# Patient Record
Sex: Male | Born: 1990 | Race: White | Hispanic: No | Marital: Single | Smoking: Current every day smoker
Health system: Southern US, Community
[De-identification: ages and names within clinical notes are randomized; demographics above are authoritative.]

## PROBLEM LIST (undated history)

## (undated) DIAGNOSIS — I2699 Other pulmonary embolism without acute cor pulmonale: Secondary | ICD-10-CM

## (undated) DIAGNOSIS — F329 Major depressive disorder, single episode, unspecified: Secondary | ICD-10-CM

## (undated) DIAGNOSIS — F988 Other specified behavioral and emotional disorders with onset usually occurring in childhood and adolescence: Secondary | ICD-10-CM

## (undated) DIAGNOSIS — F319 Bipolar disorder, unspecified: Secondary | ICD-10-CM

## (undated) DIAGNOSIS — F32A Depression, unspecified: Secondary | ICD-10-CM

## (undated) HISTORY — PX: OTHER SURGICAL HISTORY: SHX169

---
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
## 2012-02-27 ENCOUNTER — Encounter (HOSPITAL_COMMUNITY): Payer: Self-pay | Admitting: *Deleted

## 2012-02-27 ENCOUNTER — Inpatient Hospital Stay (HOSPITAL_COMMUNITY)
Admission: EM | Admit: 2012-02-27 | Discharge: 2012-03-03 | DRG: 964 | Disposition: A | Discharge status: Home / Self Care | Payer: Commercial Managed Care - PPO | Attending: General Surgery | Admitting: General Surgery

## 2012-02-27 ENCOUNTER — Emergency Department (HOSPITAL_COMMUNITY): Payer: Commercial Managed Care - PPO

## 2012-02-27 DIAGNOSIS — L299 Pruritus, unspecified: Secondary | ICD-10-CM | POA: Diagnosis not present

## 2012-02-27 DIAGNOSIS — S32599A Other specified fracture of unspecified pubis, initial encounter for closed fracture: Secondary | ICD-10-CM | POA: Diagnosis present

## 2012-02-27 DIAGNOSIS — S270XXA Traumatic pneumothorax, initial encounter: Secondary | ICD-10-CM

## 2012-02-27 DIAGNOSIS — S36039A Unspecified laceration of spleen, initial encounter: Secondary | ICD-10-CM

## 2012-02-27 DIAGNOSIS — S3609XA Other injury of spleen, initial encounter: Secondary | ICD-10-CM | POA: Diagnosis present

## 2012-02-27 DIAGNOSIS — Y9210 Unspecified residential institution as the place of occurrence of the external cause: Secondary | ICD-10-CM | POA: Diagnosis not present

## 2012-02-27 DIAGNOSIS — F988 Other specified behavioral and emotional disorders with onset usually occurring in childhood and adolescence: Secondary | ICD-10-CM | POA: Diagnosis present

## 2012-02-27 DIAGNOSIS — S2249XA Multiple fractures of ribs, unspecified side, initial encounter for closed fracture: Secondary | ICD-10-CM | POA: Diagnosis present

## 2012-02-27 DIAGNOSIS — S36899A Unspecified injury of other intra-abdominal organs, initial encounter: Secondary | ICD-10-CM | POA: Diagnosis present

## 2012-02-27 DIAGNOSIS — D62 Acute posthemorrhagic anemia: Secondary | ICD-10-CM | POA: Diagnosis present

## 2012-02-27 DIAGNOSIS — R339 Retention of urine, unspecified: Secondary | ICD-10-CM | POA: Diagnosis present

## 2012-02-27 DIAGNOSIS — S329XXA Fracture of unspecified parts of lumbosacral spine and pelvis, initial encounter for closed fracture: Secondary | ICD-10-CM

## 2012-02-27 DIAGNOSIS — S3210XA Unspecified fracture of sacrum, initial encounter for closed fracture: Secondary | ICD-10-CM | POA: Diagnosis present

## 2012-02-27 DIAGNOSIS — S3600XA Unspecified injury of spleen, initial encounter: Secondary | ICD-10-CM | POA: Diagnosis present

## 2012-02-27 DIAGNOSIS — S32509A Unspecified fracture of unspecified pubis, initial encounter for closed fracture: Principal | ICD-10-CM | POA: Diagnosis present

## 2012-02-27 DIAGNOSIS — S27329A Contusion of lung, unspecified, initial encounter: Secondary | ICD-10-CM | POA: Diagnosis present

## 2012-02-27 DIAGNOSIS — N390 Urinary tract infection, site not specified: Secondary | ICD-10-CM | POA: Diagnosis not present

## 2012-02-27 DIAGNOSIS — T40605A Adverse effect of unspecified narcotics, initial encounter: Secondary | ICD-10-CM | POA: Diagnosis not present

## 2012-02-27 HISTORY — DX: Other specified behavioral and emotional disorders with onset usually occurring in childhood and adolescence: F98.8

## 2012-02-27 LAB — CBC
HCT: 38.9 % — ABNORMAL LOW (ref 39.0–52.0)
HCT: 34.5 % — ABNORMAL LOW (ref 39.0–52.0)
HCT: 31.4 % — ABNORMAL LOW (ref 39.0–52.0)
HCT: 32 % — ABNORMAL LOW (ref 39.0–52.0)
Hemoglobin: 14.3 g/dL (ref 13.0–17.0)
Hemoglobin: 12.6 g/dL — ABNORMAL LOW (ref 13.0–17.0)
Hemoglobin: 11.4 g/dL — ABNORMAL LOW (ref 13.0–17.0)
MCH: 29.7 pg (ref 26.0–34.0)
MCH: 29.6 pg (ref 26.0–34.0)
MCH: 29.5 pg (ref 26.0–34.0)
MCHC: 36.8 g/dL — ABNORMAL HIGH (ref 30.0–36.0)
MCHC: 36.3 g/dL — ABNORMAL HIGH (ref 30.0–36.0)
MCV: 80.9 fL (ref 78.0–100.0)
MCV: 81.4 fL (ref 78.0–100.0)
MCV: 81.6 fL (ref 78.0–100.0)
MCV: 82.1 fL (ref 78.0–100.0)
Platelets: 286 10*3/uL (ref 150–400)
Platelets: 159 10*3/uL (ref 150–400)
RBC: 4.81 MIL/uL (ref 4.22–5.81)
RBC: 4.24 MIL/uL (ref 4.22–5.81)
RBC: 3.85 MIL/uL — ABNORMAL LOW (ref 4.22–5.81)
RDW: 12.5 % (ref 11.5–15.5)
RDW: 12.7 % (ref 11.5–15.5)
RDW: 13 % (ref 11.5–15.5)
WBC: 15.4 10*3/uL — ABNORMAL HIGH (ref 4.0–10.5)
WBC: 18.9 10*3/uL — ABNORMAL HIGH (ref 4.0–10.5)
WBC: 9.5 10*3/uL (ref 4.0–10.5)

## 2012-02-27 LAB — BASIC METABOLIC PANEL
BUN: 11 mg/dL (ref 6–23)
CO2: 26 mEq/L (ref 19–32)
Calcium: 9 mg/dL (ref 8.4–10.5)
Chloride: 103 mEq/L (ref 96–112)
Creatinine, Ser: 1.12 mg/dL (ref 0.50–1.35)
GFR calc Af Amer: >90 mL/min (ref >90)
GFR calc non Af Amer: >90 mL/min (ref >90)
Glucose, Bld: 183 mg/dL — ABNORMAL HIGH (ref 70–99)
Potassium: 3.5 mEq/L (ref 3.5–5.1)
Sodium: 140 mEq/L (ref 135–145)

## 2012-02-27 LAB — POCT I-STAT, CHEM 8
BUN: 10 mg/dL (ref 6–23)
Calcium, Ion: 1.18 mmol/L (ref 1.12–1.32)
Chloride: 103 mEq/L (ref 96–112)
Creatinine, Ser: 1.1 mg/dL (ref 0.50–1.35)
Glucose, Bld: 183 mg/dL — ABNORMAL HIGH (ref 70–99)
HCT: 41 % (ref 39.0–52.0)
Hemoglobin: 13.9 g/dL (ref 13.0–17.0)
Potassium: 3.4 mEq/L — ABNORMAL LOW (ref 3.5–5.1)
Sodium: 143 mEq/L (ref 135–145)
TCO2: 26 mmol/L (ref 0–100)

## 2012-02-27 LAB — DIFFERENTIAL
Basophils Absolute: 0 10*3/uL (ref 0.0–0.1)
Basophils Relative: 0 % (ref 0–1)
Eosinophils Absolute: 0.1 10*3/uL (ref 0.0–0.7)
Eosinophils Relative: 1 % (ref 0–5)
Lymphocytes Relative: 21 % (ref 12–46)
Lymphs Abs: 3.2 10*3/uL (ref 0.7–4.0)
Monocytes Absolute: 0.5 10*3/uL (ref 0.1–1.0)
Monocytes Relative: 3 % (ref 3–12)
Neutro Abs: 11.7 10*3/uL — ABNORMAL HIGH (ref 1.7–7.7)
Neutrophils Relative %: 76 % (ref 43–77)

## 2012-02-27 LAB — URINALYSIS, ROUTINE W REFLEX MICROSCOPIC
Bilirubin Urine: NEGATIVE
Glucose, UA: NEGATIVE mg/dL
Ketones, ur: NEGATIVE mg/dL
Leukocytes, UA: NEGATIVE
Nitrite: NEGATIVE
Protein, ur: NEGATIVE mg/dL
Specific Gravity, Urine: >1.046 — ABNORMAL HIGH (ref 1.005–1.030)
Urobilinogen, UA: 0.2 mg/dL (ref 0.0–1.0)
pH: 5 (ref 5.0–8.0)

## 2012-02-27 LAB — MRSA PCR SCREENING: MRSA by PCR: NEGATIVE

## 2012-02-27 MED ORDER — MORPHINE SULFATE 4 MG/ML IJ SOLN
4.0000 mg | Freq: Once | INTRAMUSCULAR | Status: AC
  Administered 2012-02-27: 4 mg via INTRAVENOUS
  Filled 2012-02-27: qty 1

## 2012-02-27 MED ORDER — MORPHINE SULFATE 4 MG/ML IJ SOLN
INTRAMUSCULAR | Status: AC
  Administered 2012-02-27: 4 mg
  Filled 2012-02-27: qty 1

## 2012-02-27 MED ORDER — DEXTROSE IN LACTATED RINGERS 5 % IV SOLN
INTRAVENOUS | Status: DC
  Administered 2012-02-27 – 2012-02-29 (×5): via INTRAVENOUS

## 2012-02-27 MED ORDER — ONDANSETRON HCL 4 MG PO TABS: 4.0000 mg | ORAL_TABLET | Freq: Four times a day (QID) | ORAL | Status: DC | PRN

## 2012-02-27 MED ORDER — MORPHINE SULFATE 2 MG/ML IJ SOLN
INTRAMUSCULAR | Status: AC
  Filled 2012-02-27: qty 1

## 2012-02-27 MED ORDER — ONDANSETRON HCL 4 MG/2ML IJ SOLN
INTRAMUSCULAR | Status: AC
  Filled 2012-02-27: qty 2

## 2012-02-27 MED ORDER — ONDANSETRON HCL 4 MG/2ML IJ SOLN
4.0000 mg | Freq: Four times a day (QID) | INTRAMUSCULAR | Status: DC | PRN
  Administered 2012-02-27: 4 mg via INTRAVENOUS

## 2012-02-27 MED ORDER — PANTOPRAZOLE SODIUM 40 MG PO TBEC: 40.0000 mg | DELAYED_RELEASE_TABLET | Freq: Every day | ORAL | Status: DC

## 2012-02-27 MED ORDER — IOHEXOL 300 MG/ML  SOLN
100.0000 mL | Freq: Once | INTRAMUSCULAR | Status: AC | PRN
  Administered 2012-02-27: 100 mL via INTRAVENOUS

## 2012-02-27 MED ORDER — CHLORHEXIDINE GLUCONATE 0.12 % MT SOLN
OROMUCOSAL | Status: AC
  Administered 2012-02-27: 20:00:00
  Filled 2012-02-27: qty 15

## 2012-02-27 MED ORDER — MORPHINE SULFATE 2 MG/ML IJ SOLN
1.0000 mg | INTRAMUSCULAR | Status: DC | PRN
  Administered 2012-02-27 – 2012-02-28 (×14): 2 mg via INTRAVENOUS
  Filled 2012-02-27 (×14): qty 1

## 2012-02-27 MED ORDER — ONDANSETRON HCL 4 MG/2ML IJ SOLN
4.0000 mg | Freq: Once | INTRAMUSCULAR | Status: AC
  Administered 2012-02-27: 4 mg via INTRAVENOUS
  Filled 2012-02-27: qty 2

## 2012-02-27 MED ORDER — PANTOPRAZOLE SODIUM 40 MG IV SOLR
40.0000 mg | Freq: Every day | INTRAVENOUS | Status: DC
Start: 2012-02-27 — End: 2012-02-29
  Administered 2012-02-27 – 2012-02-28 (×2): 40 mg via INTRAVENOUS
  Filled 2012-02-27 (×3): qty 40
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
## 2012-02-27 NOTE — ED Notes (Signed)
Attempted to call report to [phone], informed nurse would call back soon.  
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
## 2012-02-28 ENCOUNTER — Inpatient Hospital Stay (HOSPITAL_COMMUNITY): Payer: Commercial Managed Care - PPO

## 2012-02-28 DIAGNOSIS — S32599A Other specified fracture of unspecified pubis, initial encounter for closed fracture: Secondary | ICD-10-CM | POA: Diagnosis present

## 2012-02-28 DIAGNOSIS — S3600XA Unspecified injury of spleen, initial encounter: Secondary | ICD-10-CM | POA: Diagnosis present

## 2012-02-28 DIAGNOSIS — S3210XA Unspecified fracture of sacrum, initial encounter for closed fracture: Secondary | ICD-10-CM | POA: Diagnosis present

## 2012-02-28 DIAGNOSIS — D62 Acute posthemorrhagic anemia: Secondary | ICD-10-CM | POA: Diagnosis not present

## 2012-02-28 DIAGNOSIS — S270XXA Traumatic pneumothorax, initial encounter: Secondary | ICD-10-CM | POA: Diagnosis present

## 2012-02-28 LAB — CBC
HCT: 32.3 % — ABNORMAL LOW (ref 39.0–52.0)
Hemoglobin: 11.4 g/dL — ABNORMAL LOW (ref 13.0–17.0)
MCH: 29.8 pg (ref 26.0–34.0)
MCHC: 35.9 g/dL (ref 30.0–36.0)
MCHC: 36.2 g/dL — ABNORMAL HIGH (ref 30.0–36.0)
MCV: 82.6 fL (ref 78.0–100.0)
Platelets: 154 10*3/uL (ref 150–400)
RDW: 12.8 % (ref 11.5–15.5)
RDW: 12.9 % (ref 11.5–15.5)

## 2012-02-28 LAB — BASIC METABOLIC PANEL
BUN: 5 mg/dL — ABNORMAL LOW (ref 6–23)
CO2: 28 mEq/L (ref 19–32)
Calcium: 8.8 mg/dL (ref 8.4–10.5)
Chloride: 101 mEq/L (ref 96–112)
Creatinine, Ser: 0.85 mg/dL (ref 0.50–1.35)

## 2012-02-28 MED ORDER — AMPHETAMINE-DEXTROAMPHET ER 20 MG PO CP24
20.0000 mg | ORAL_CAPSULE | Freq: Two times a day (BID) | ORAL | Status: DC
  Filled 2012-02-28 (×3): qty 2

## 2012-02-28 MED ORDER — MORPHINE SULFATE 4 MG/ML IJ SOLN
INTRAMUSCULAR | Status: AC
  Administered 2012-02-28: 2 mg
  Filled 2012-02-28: qty 1

## 2012-02-28 MED ORDER — OXYCODONE-ACETAMINOPHEN 5-325 MG PO TABS
1.0000 | ORAL_TABLET | ORAL | Status: DC | PRN
  Administered 2012-02-28 (×2): 1 via ORAL
  Administered 2012-02-28 – 2012-02-29 (×3): 2 via ORAL
  Filled 2012-02-28: qty 1
  Filled 2012-02-28 (×2): qty 2
  Filled 2012-02-28: qty 1
  Filled 2012-02-28: qty 2

## 2012-02-28 NOTE — Progress Notes (Signed)
James Barajas  MRN: [id number] DOB/Age: 19-May-1991 20 y.o. Physician: Jacquelyne Balint Procedure:       Subjective: Awake and alert. Most complaints are of right hip pain.   Vital Signs Temp:  [98.2 F (36.8 C)-100.8 F (38.2 C)] 98.3 F (36.8 C) (05/20 1136) Pulse Rate:  [88-108] 95  (05/20 1000) Resp:  [0-26] 9  (05/20 1000) BP: (92-110)/(47-64) 106/56 mmHg (05/20 1000) SpO2:  [92 %-99 %] 97 % (05/20 1000)  Lab Results  Basename 02/28/12 0600 02/28/12 0033  WBC 10.3 9.6  HGB 11.6* 11.4*  HCT 32.3* 31.5*  PLT 154 148*   BMET  Basename 02/28/12 0600 02/27/12 0105 02/27/12 0055  NA 135 143 --  K 3.5 3.4* --  CL 101 103 --  CO2 28 -- 26  GLUCOSE 115* 183* --  BUN 5* 10 --  CREATININE 0.85 1.10 --  CALCIUM 8.8 -- 9.0   No results found for this basename: inr     Exam Moving feet and ankles well. NVI        Plan Mobilize with PT WBAT  Discussed with patient and mother.  Fu for xray in 1 month  James Barajas for Dr.Kevin Supple 02/28/2012, 12:42 PM
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
## 2012-02-29 LAB — CBC
HCT: 30.3 % — ABNORMAL LOW (ref 39.0–52.0)
Hemoglobin: 11 g/dL — ABNORMAL LOW (ref 13.0–17.0)
MCH: 29.7 pg (ref 26.0–34.0)
MCHC: 36.3 g/dL — ABNORMAL HIGH (ref 30.0–36.0)
RDW: 12.5 % (ref 11.5–15.5)

## 2012-02-29 LAB — URINALYSIS, ROUTINE W REFLEX MICROSCOPIC
Glucose, UA: NEGATIVE mg/dL
Hgb urine dipstick: NEGATIVE
Ketones, ur: 15 mg/dL — AB
Protein, ur: NEGATIVE mg/dL
Urobilinogen, UA: 1 mg/dL (ref 0.0–1.0)

## 2012-02-29 LAB — URINE MICROSCOPIC-ADD ON

## 2012-02-29 MED ORDER — BETHANECHOL CHLORIDE 25 MG PO TABS
25.0000 mg | ORAL_TABLET | Freq: Four times a day (QID) | ORAL | Status: DC
  Administered 2012-02-29 – 2012-03-01 (×7): 25 mg via ORAL
  Filled 2012-02-29 (×13): qty 1

## 2012-02-29 MED ORDER — CIPROFLOXACIN HCL 500 MG PO TABS
500.0000 mg | ORAL_TABLET | Freq: Two times a day (BID) | ORAL | Status: AC
  Administered 2012-02-29 – 2012-03-03 (×6): 500 mg via ORAL
  Filled 2012-02-29 (×6): qty 1

## 2012-02-29 MED ORDER — HYDROCODONE-ACETAMINOPHEN 10-325 MG PO TABS
0.5000 | ORAL_TABLET | ORAL | Status: DC | PRN
  Administered 2012-02-29: 1 via ORAL
  Administered 2012-02-29 – 2012-03-02 (×10): 2 via ORAL
  Administered 2012-03-03: 1 via ORAL
  Administered 2012-03-03: 2 via ORAL
  Administered 2012-03-03: 1 via ORAL
  Filled 2012-02-29 (×6): qty 2
  Filled 2012-02-29: qty 1
  Filled 2012-02-29: qty 2
  Filled 2012-02-29: qty 1
  Filled 2012-02-29: qty 2
  Filled 2012-02-29: qty 1
  Filled 2012-02-29 (×3): qty 2

## 2012-02-29 MED ORDER — TRAMADOL HCL 50 MG PO TABS
50.0000 mg | ORAL_TABLET | Freq: Four times a day (QID) | ORAL | Status: DC | PRN
  Administered 2012-02-29: 100 mg via ORAL
  Filled 2012-02-29: qty 2

## 2012-02-29 MED ORDER — MORPHINE SULFATE 2 MG/ML IJ SOLN
2.0000 mg | INTRAMUSCULAR | Status: DC | PRN
  Administered 2012-02-29 – 2012-03-01 (×3): 2 mg via INTRAVENOUS
  Filled 2012-02-29 (×3): qty 1

## 2012-02-29 MED ORDER — DIPHENHYDRAMINE HCL 25 MG PO CAPS
25.0000 mg | ORAL_CAPSULE | Freq: Four times a day (QID) | ORAL | Status: DC | PRN
  Administered 2012-03-01 – 2012-03-02 (×3): 25 mg via ORAL
  Filled 2012-02-29 (×3): qty 1
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
## 2012-02-29 NOTE — Progress Notes (Signed)
Urinary retention: urecholine +UTI - start Cipro. I D/W patient  Patient examined and I agree with the assessment and plan  Violeta Gelinas, MD, MPH, FACS Pager: [phone]  02/29/2012 4:54 PM

## 2012-02-29 NOTE — Progress Notes (Signed)
Re-inserted foley catheter per MD standing orders. Two bladder scans greater than 300 ml.
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
## 2012-03-01 DIAGNOSIS — N390 Urinary tract infection, site not specified: Secondary | ICD-10-CM | POA: Diagnosis not present

## 2012-03-01 DIAGNOSIS — R339 Retention of urine, unspecified: Secondary | ICD-10-CM | POA: Diagnosis not present

## 2012-03-01 LAB — CBC
HCT: 28.5 % — ABNORMAL LOW (ref 39.0–52.0)
Hemoglobin: 10.4 g/dL — ABNORMAL LOW (ref 13.0–17.0)
MCH: 29.5 pg (ref 26.0–34.0)
MCV: 81 fL (ref 78.0–100.0)
Platelets: 153 10*3/uL (ref 150–400)
RBC: 3.52 MIL/uL — ABNORMAL LOW (ref 4.22–5.81)
RDW: 12.6 % (ref 11.5–15.5)
WBC: 5.8 10*3/uL (ref 4.0–10.5)

## 2012-03-01 MED ORDER — HYDROXYZINE HCL 25 MG PO TABS
25.0000 mg | ORAL_TABLET | Freq: Four times a day (QID) | ORAL | Status: DC | PRN
  Administered 2012-03-01: 25 mg via ORAL
  Filled 2012-03-01: qty 1

## 2012-03-01 MED ORDER — LORATADINE 10 MG PO TABS
10.0000 mg | ORAL_TABLET | Freq: Every day | ORAL | Status: DC
  Administered 2012-03-02 – 2012-03-03 (×2): 10 mg via ORAL
  Filled 2012-03-01 (×2): qty 1
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
## 2012-03-01 NOTE — Progress Notes (Signed)
Pruritis has been a considerable problem with the oral narcotics, both hydrocodone and oxycodone.  Benadryl and Atarax made the patient very sleepy.  His father (who is a Therapist, music) wanted to consider using Claritin which is non-sedating but can possibly help with the pruritis. This patient has been seen and I agree with the findings and treatment plan.  Marta Lamas. Gae Bon, MD, FACS [phone] (pager) [phone] (direct pager) Trauma Surgeon

## 2012-03-01 NOTE — Progress Notes (Signed)
PT Cancellation Note  Treatment cancelled today due to per Trauma note "mobilize with PT tomorrow".  Will hold PT until tomorrow.  Please clarify activity order if wanting transfers only or if pt is able to ambulate.  Thanks.    Sunny Schlein, [place] [phone] 03/01/2012, 2:37 PM
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
## 2012-03-02 LAB — CBC
HCT: 29.1 % — ABNORMAL LOW (ref 39.0–52.0)
MCH: 29.8 pg (ref 26.0–34.0)
MCHC: 36.4 g/dL — ABNORMAL HIGH (ref 30.0–36.0)
RDW: 12.5 % (ref 11.5–15.5)

## 2012-03-02 MED ORDER — ALUM & MAG HYDROXIDE-SIMETH 200-200-20 MG/5ML PO SUSP
30.0000 mL | Freq: Four times a day (QID) | ORAL | Status: DC | PRN
  Administered 2012-03-02: 30 mL via ORAL
  Filled 2012-03-02: qty 30

## 2012-03-02 MED ORDER — DOCUSATE SODIUM 100 MG PO CAPS
100.0000 mg | ORAL_CAPSULE | Freq: Two times a day (BID) | ORAL | Status: DC
  Administered 2012-03-02 – 2012-03-03 (×3): 100 mg via ORAL
  Filled 2012-03-02 (×3): qty 1

## 2012-03-02 MED ORDER — LIP MEDEX EX OINT
1.0000 "application " | TOPICAL_OINTMENT | Freq: Two times a day (BID) | CUTANEOUS | Status: DC
  Administered 2012-03-03: 1 via TOPICAL
  Filled 2012-03-02: qty 7

## 2012-03-02 MED ORDER — POLYETHYLENE GLYCOL 3350 17 G PO PACK
17.0000 g | PACK | Freq: Every day | ORAL | Status: DC
  Administered 2012-03-02: 17 g via ORAL
  Filled 2012-03-02 (×2): qty 1

## 2012-03-02 MED ORDER — PANTOPRAZOLE SODIUM 40 MG PO TBEC: 40.0000 mg | DELAYED_RELEASE_TABLET | Freq: Every day | ORAL | Status: DC

## 2012-03-02 MED ORDER — BETHANECHOL CHLORIDE 25 MG PO TABS
25.0000 mg | ORAL_TABLET | Freq: Three times a day (TID) | ORAL | Status: DC
  Administered 2012-03-02 (×3): 25 mg via ORAL
  Filled 2012-03-02 (×6): qty 1

## 2012-03-02 MED ORDER — MAGIC MOUTHWASH
15.0000 mL | Freq: Four times a day (QID) | ORAL | Status: DC | PRN
  Filled 2012-03-02: qty 15

## 2012-03-02 NOTE — Progress Notes (Signed)
Physical Therapy Treatment Patient Details Name: James Barajas MRN: [id number] DOB: 20-Sep-1991 Today's Date: 03/02/2012 Time: 1308-6578 PT Time Calculation (min): 25 min  PT Assessment / Plan / Recommendation Comments on Treatment Session  Did great this afternoon. Practiced stairs two different ways based on mom and dad's house. He reports he will make the decision of what place to go to by tonight. Plan is to go into the stairwell tomorrow and attempt more stairs. Pt able to verbalize back to me safe hand placement with RW and stair technique.     Follow Up Recommendations  Home health PT;Supervision - Intermittent    Barriers to Discharge        Equipment Recommendations  Rolling walker with 5" wheels    Recommendations for Other Services    Frequency Min 3X/week   Plan Discharge plan remains appropriate;Frequency remains appropriate    Precautions / Restrictions Precautions Precautions: Fall Restrictions RLE Weight Bearing: Weight bearing as tolerated       Mobility  Bed Mobility Bed Mobility: Supine to Sit Supine to Sit: 6: Modified independent (Device/Increase time) Sitting - Scoot to Edge of Bed: 4: Min assist Details for Bed Mobility Assistance: sat up long sitting modI with minA to bring leg across bed Transfers Sit to Stand: 5: Supervision Stand to Sit: 5: Supervision Details for Transfer Assistance: cues for safe hand placement Ambulation/Gait Ambulation/Gait Assistance: 5: Supervision Ambulation Distance (Feet): 50 Feet Assistive device: Rolling walker Ambulation/Gait Assistance Details: cues for technique with RW and sequencing of stepping; demonstrated crutches with pt but he declined attempting with these Gait Pattern: Step-to pattern Stairs: Yes Stairs Assistance: 5: Supervision;4: Min guard Stairs Assistance Details (indicate cue type and reason): cues for technique forward with two rails vs one rail sideways; pt did well with both, preferred two  rails but equal ability; able to verbalize technique back to me Stair Management Technique: Two rails;One rail Left;Step to pattern;Forwards;Sideways Number of Stairs: 4  (2x2)    Exercises      PT Goals Acute Rehab PT Goals PT Goal: Supine/Side to Sit - Progress: Progressing toward goal PT Goal: Sit to Supine/Side - Progress: Progressing toward goal Pt will go Sit to Stand: with modified independence PT Goal: Sit to Stand - Progress: Progressing toward goal Pt will go Stand to Sit: with modified independence PT Goal: Stand to Sit - Progress: Progressing toward goal PT Goal: Ambulate - Progress: Progressing toward goal PT Goal: Up/Down Stairs - Progress: Progressing toward goal  Visit Information  Last PT Received On: 03/02/12 Assistance Needed: +1    Subjective Data  Subjective: I guess Im ready.   Cognition  Overall Cognitive Status: Appears within functional limits for tasks assessed/performed Arousal/Alertness: Lethargic (groggy from pain medicine) Orientation Level: Appears intact for tasks assessed Behavior During Session: [facility] for tasks performed    Balance     End of Session PT - End of Session Equipment Utilized During Treatment: Gait belt Activity Tolerance: Patient tolerated treatment well Patient left: in chair;with call bell/phone within reach    [facility] HELEN 03/02/2012, 3:56 PM

## 2012-03-02 NOTE — Progress Notes (Signed)
Physical Therapy Treatment Patient Details Name: James Barajas MRN: [id number] DOB: 1991-02-10 Today's Date: 03/02/2012 Time: 4098-1191 PT Time Calculation (min): 31 min  PT Assessment / Plan / Recommendation Comments on Treatment Session  Did well this morning, ambulating well with minimal cues for technique. He will have to go to his mother's apartment (at least 14 steps to get in, unsure if there is any railings). Plan to begin stair training this pm. Mother appears very concerned, will speak with her later this morning.     Follow Up Recommendations  Home health PT;Supervision - Intermittent    Barriers to Discharge        Equipment Recommendations  Rolling walker with 5" wheels    Recommendations for Other Services    Frequency     Plan Discharge plan remains appropriate;Frequency remains appropriate    Precautions / Restrictions Precautions Precautions: Fall Restrictions Weight Bearing Restrictions: Yes RLE Weight Bearing: Weight bearing as tolerated       Mobility  Bed Mobility Supine to Sit: 4: Min assist Sitting - Scoot to Edge of Bed: 5: Supervision Details for Bed Mobility Assistance: minA for RLE only; verbal cues for positioning in prep to stand, bed raised slightly to ease sit->stand Transfers Transfers: Sit to Stand;Stand to Sit Sit to Stand: From elevated surface;5: Supervision;From bed;With upper extremity assist Stand to Sit: To chair/3-in-1;5: Supervision;With upper extremity assist Details for Transfer Assistance: cues for safe hand placement Ambulation/Gait Ambulation/Gait Assistance: 5: Supervision;4: Min guard Ambulation Distance (Feet): 200 Feet Assistive device: Rolling walker Ambulation/Gait Assistance Details: cues for step-to gait as pt tends to drag RLE through instead of attempting step; cues for safe use of and positioning of RW; mingaurdA especially during turns Gait Pattern: Step-to pattern    Exercises      PT Goals Acute Rehab  PT Goals PT Goal: Supine/Side to Sit - Progress: Progressing toward goal PT Goal: Sit to Stand - Progress: Progressing toward goal PT Goal: Stand to Sit - Progress: Progressing toward goal PT Goal: Ambulate - Progress: Progressing toward goal  Visit Information  Last PT Received On: 03/02/12 Assistance Needed: +1    Subjective Data  Subjective: Mom, you can leave now.    Cognition  Overall Cognitive Status: Appears within functional limits for tasks assessed/performed Arousal/Alertness: Lethargic (groggy from pain meds) Orientation Level: Appears intact for tasks assessed Behavior During Session: Flat affect Cognition - Other Comments: appears flat and frustrated with the situation, but agreeable and polite    Balance     End of Session PT - End of Session Equipment Utilized During Treatment: Gait belt Activity Tolerance: Patient tolerated treatment well Patient left: in chair;with call bell/phone within reach Nurse Communication: Mobility status    [facility] HELEN 03/02/2012, 9:31 AM

## 2012-03-02 NOTE — Progress Notes (Signed)
Doing better Walked down hall with walker and PT Will need to get to modified independent level Patient examined and I agree with the assessment and plan I spoke to patient and his mother and answered their questions  Violeta Gelinas, MD, MPH, FACS Pager: [phone]  03/02/2012 11:01 AM

## 2012-03-02 NOTE — Progress Notes (Signed)
03/02/12 1628  OT Visit Information  Last OT Received On 03/02/12  Assistance Needed +1  OT Time Calculation  OT Start Time 1545  OT Stop Time 1605  OT Time Calculation (min) 20 min  Precautions  Precautions Fall  Restrictions  RLE Weight Bearing WBAT  ADL  ADL Comments Educated pt on LB dsg and bathing with use of LH reacher and sponge. Pt verbalized/demonstrated understanding. discussed with pt how to adapt grooming (e.g. teeth brushing) so as to avoid excessive WB/pain on RLE. Pt with concern over caring for cat, mother stated she would assist for now. Also, discussed meal prep with pt and encouraged pt to pre-make with supervision or have parents pre-make sandwiches/"bagged" lunches that pt can put in walker bag (provided).   Bed Mobility  Sit to Supine 5: Supervision;With rail;HOB flat  Transfers  Sit to Stand 5: Supervision  Stand to Sit 5: Supervision  Details for Transfer Assistance pt with good carryover of hand placement  OT - End of Session  Equipment Utilized During Treatment Gait belt  Activity Tolerance Patient tolerated treatment well  Patient left in bed;with call bell/phone within reach;with family/visitor present  OT Assessment/Plan  OT Plan Discharge plan remains appropriate  Follow Up Recommendations No OT follow up;Supervision - Intermittent  Equipment Recommended Rolling walker with 5" wheels;Tub/shower bench (may need 3n1 pending if there is sink by toilet to push up)  ADL Goals  ADL Goal: Grooming - Progress Progressing toward goals  ADL Goal: Lower Body Bathing - Progress Progressing toward goals  ADL Goal: Lower Body Dressing - Progress Progressing toward goals

## 2012-03-02 NOTE — Progress Notes (Signed)
UR complete 
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
## 2012-03-03 LAB — CBC
MCH: 28.8 pg (ref 26.0–34.0)
Platelets: 208 10*3/uL (ref 150–400)
RBC: 3.93 MIL/uL — ABNORMAL LOW (ref 4.22–5.81)
RDW: 12.4 % (ref 11.5–15.5)
WBC: 6.9 10*3/uL (ref 4.0–10.5)

## 2012-03-03 MED ORDER — HYDROCODONE-ACETAMINOPHEN 10-325 MG PO TABS: 0.5000 | ORAL_TABLET | ORAL | Status: AC | PRN

## 2012-03-03 MED ORDER — DSS 100 MG PO CAPS: 100.0000 mg | ORAL_CAPSULE | Freq: Two times a day (BID) | ORAL | Status: AC

## 2012-03-03 MED ORDER — POLYETHYLENE GLYCOL 3350 17 G PO PACK: 17.0000 g | PACK | Freq: Every day | ORAL | Status: AC

## 2012-03-03 MED ORDER — LORATADINE 10 MG PO TABS: 10.0000 mg | ORAL_TABLET | Freq: Every day | ORAL | Status: DC | PRN

## 2012-03-03 NOTE — Progress Notes (Signed)
PT Cancellation Note  Treatment cancelled today due to patient's refusal to participate. Pt wants to save his energy for going home and doesn't want to practice ambulation this morning. Pt and mother deny concerns or questions. Offered practice of stairs in the stairwell but pt not interested, feels he will be fine. Pt will need RW and tub/shower transfer bench prior to d/c.   [facility] HELEN 03/03/2012, 8:26 AM Pager: [phone]
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
## 2012-03-03 NOTE — Progress Notes (Signed)
Mother's address [address], [place], [place] [address]. [phone] is the best number to call. This is where pt will d/c initially. Home address on facesheet is for pt's father and that is who the insurance is through and where the bills should be sent.   Rolling walker and tub transfer bench obtained from [facility]. Mother chose to use [facility] for HHPT also after being given choice of other agencies in town also in [facility] network.

## 2012-03-03 NOTE — Discharge Summary (Signed)
Okay to go home.  This patient has been seen and I agree with the findings and treatment plan.  Wafaa Deemer O. Juliette Standre, III, MD, FACS [phone] (pager) [phone] (direct pager) Trauma Surgeon 

## 2012-03-03 NOTE — Discharge Instructions (Signed)
No running, jumping, bikes, skateboards, ball or contact sports for 3 months.  No driving while on hydrocodone.

## 2012-03-03 NOTE — Progress Notes (Signed)
No bowel movement but passing gas.  Okay to go home.  This patient has been seen and I agree with the findings and treatment plan.  Marta Lamas. Gae Bon, MD, FACS [phone] (pager) [phone] (direct pager) Trauma Surgeon

## 2012-03-03 NOTE — Progress Notes (Signed)
DC home with mother and supplies. Verbally understood DC instructions. No verbal questions
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
## 2012-03-04 ENCOUNTER — Emergency Department (HOSPITAL_COMMUNITY): Payer: Commercial Managed Care - PPO

## 2012-03-04 ENCOUNTER — Encounter (HOSPITAL_COMMUNITY): Payer: Self-pay

## 2012-03-04 ENCOUNTER — Telehealth (INDEPENDENT_AMBULATORY_CARE_PROVIDER_SITE_OTHER): Payer: Self-pay | Admitting: General Surgery

## 2012-03-04 ENCOUNTER — Inpatient Hospital Stay (HOSPITAL_COMMUNITY)
Admission: EM | Admit: 2012-03-04 | Discharge: 2012-03-09 | DRG: 982 | Disposition: A | Discharge status: Home Health | Payer: Commercial Managed Care - PPO | Attending: General Surgery | Admitting: General Surgery

## 2012-03-04 ENCOUNTER — Inpatient Hospital Stay (HOSPITAL_COMMUNITY): Payer: Commercial Managed Care - PPO

## 2012-03-04 DIAGNOSIS — S3210XA Unspecified fracture of sacrum, initial encounter for closed fracture: Secondary | ICD-10-CM | POA: Diagnosis present

## 2012-03-04 DIAGNOSIS — I2699 Other pulmonary embolism without acute cor pulmonale: Secondary | ICD-10-CM

## 2012-03-04 DIAGNOSIS — K560 Paralytic ileus: Secondary | ICD-10-CM | POA: Diagnosis present

## 2012-03-04 DIAGNOSIS — S3600XA Unspecified injury of spleen, initial encounter: Secondary | ICD-10-CM | POA: Diagnosis present

## 2012-03-04 DIAGNOSIS — I728 Aneurysm of other specified arteries: Secondary | ICD-10-CM

## 2012-03-04 DIAGNOSIS — Z7901 Long term (current) use of anticoagulants: Secondary | ICD-10-CM

## 2012-03-04 DIAGNOSIS — I824Z9 Acute embolism and thrombosis of unspecified deep veins of unspecified distal lower extremity: Secondary | ICD-10-CM | POA: Diagnosis present

## 2012-03-04 DIAGNOSIS — S32509A Unspecified fracture of unspecified pubis, initial encounter for closed fracture: Secondary | ICD-10-CM | POA: Diagnosis present

## 2012-03-04 DIAGNOSIS — Z79899 Other long term (current) drug therapy: Secondary | ICD-10-CM

## 2012-03-04 DIAGNOSIS — Z888 Allergy status to other drugs, medicaments and biological substances status: Secondary | ICD-10-CM

## 2012-03-04 LAB — DIFFERENTIAL
Basophils Absolute: 0 10*3/uL (ref 0.0–0.1)
Basophils Relative: 0 % (ref 0–1)
Eosinophils Absolute: 0.1 10*3/uL (ref 0.0–0.7)
Eosinophils Relative: 1 % (ref 0–5)

## 2012-03-04 LAB — CBC
MCH: 29.7 pg (ref 26.0–34.0)
MCV: 81.7 fL (ref 78.0–100.0)
Platelets: 267 10*3/uL (ref 150–400)
RDW: 12.6 % (ref 11.5–15.5)

## 2012-03-04 LAB — COMPREHENSIVE METABOLIC PANEL
ALT: 147 U/L — ABNORMAL HIGH (ref 0–53)
AST: 91 U/L — ABNORMAL HIGH (ref 0–37)
Calcium: 9.9 mg/dL (ref 8.4–10.5)
GFR calc Af Amer: >90 mL/min (ref >90)
Sodium: 134 mEq/L — ABNORMAL LOW (ref 135–145)
Total Protein: 7.4 g/dL (ref 6.0–8.3)

## 2012-03-04 MED ORDER — MORPHINE SULFATE 4 MG/ML IJ SOLN
4.0000 mg | INTRAMUSCULAR | Status: DC | PRN
  Administered 2012-03-05: 4 mg via INTRAVENOUS
  Filled 2012-03-04: qty 1

## 2012-03-04 MED ORDER — IOHEXOL 350 MG/ML SOLN
100.0000 mL | Freq: Once | INTRAVENOUS | Status: AC | PRN
  Administered 2012-03-04: 100 mL via INTRAVENOUS

## 2012-03-04 MED ORDER — MORPHINE SULFATE 4 MG/ML IJ SOLN
4.0000 mg | INTRAMUSCULAR | Status: DC | PRN
  Administered 2012-03-04: 4 mg via INTRAVENOUS
  Filled 2012-03-04: qty 1

## 2012-03-04 MED ORDER — IOHEXOL 300 MG/ML  SOLN
75.0000 mL | Freq: Once | INTRAMUSCULAR | Status: AC | PRN
  Administered 2012-03-04: 75 mL via INTRAVENOUS

## 2012-03-04 MED ORDER — ONDANSETRON HCL 4 MG PO TABS: 4.0000 mg | ORAL_TABLET | Freq: Four times a day (QID) | ORAL | Status: DC | PRN

## 2012-03-04 MED ORDER — MORPHINE SULFATE 2 MG/ML IJ SOLN
2.0000 mg | INTRAMUSCULAR | Status: DC | PRN
  Administered 2012-03-04 (×2): 2 mg via INTRAVENOUS
  Filled 2012-03-04: qty 1

## 2012-03-04 MED ORDER — MORPHINE SULFATE 2 MG/ML IJ SOLN
1.0000 mg | INTRAMUSCULAR | Status: DC | PRN
  Filled 2012-03-04: qty 1

## 2012-03-04 MED ORDER — KCL IN DEXTROSE-NACL 20-5-0.45 MEQ/L-%-% IV SOLN
INTRAVENOUS | Status: DC
  Administered 2012-03-05: 03:00:00 via INTRAVENOUS
  Administered 2012-03-05: 100 mL via INTRAVENOUS
  Administered 2012-03-06: 18:00:00 via INTRAVENOUS
  Administered 2012-03-06: 100 mL via INTRAVENOUS
  Administered 2012-03-07 (×2): via INTRAVENOUS
  Filled 2012-03-04 (×13): qty 1000

## 2012-03-04 MED ORDER — ONDANSETRON HCL 4 MG/2ML IJ SOLN: 4.0000 mg | Freq: Four times a day (QID) | INTRAMUSCULAR | Status: DC | PRN

## 2012-03-04 MED ORDER — MORPHINE SULFATE 2 MG/ML IJ SOLN: 2.0000 mg | INTRAMUSCULAR | Status: DC | PRN
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
## 2012-03-04 NOTE — ED Notes (Signed)
Performed AIDET.INtroduced self to PT. Family at bedside.. No needs at this time

## 2012-03-04 NOTE — ED Notes (Signed)
MD back in room to update family of Ct angio result.
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
## 2012-03-04 NOTE — ED Notes (Signed)
Pt repositioned and placed on monitor.

## 2012-03-04 NOTE — ED Notes (Addendum)
MP  returned from Ct, Md going to talk to radiologist and pt will be admitted to step down.

## 2012-03-04 NOTE — ED Notes (Signed)
PT returned from CT,placed on monitor

## 2012-03-04 NOTE — ED Notes (Signed)
MD in the room and updated pt and his mother.

## 2012-03-04 NOTE — ED Notes (Signed)
Plan for IR family updated by MD

## 2012-03-05 ENCOUNTER — Inpatient Hospital Stay (HOSPITAL_COMMUNITY): Payer: Commercial Managed Care - PPO

## 2012-03-05 DIAGNOSIS — I2699 Other pulmonary embolism without acute cor pulmonale: Secondary | ICD-10-CM

## 2012-03-05 LAB — COMPREHENSIVE METABOLIC PANEL
ALT: 117 U/L — ABNORMAL HIGH (ref 0–53)
CO2: 27 mEq/L (ref 19–32)
Calcium: 9.5 mg/dL (ref 8.4–10.5)
Creatinine, Ser: 0.59 mg/dL (ref 0.50–1.35)
GFR calc Af Amer: >90 mL/min (ref >90)
GFR calc non Af Amer: >90 mL/min (ref >90)
Glucose, Bld: 138 mg/dL — ABNORMAL HIGH (ref 70–99)
Total Bilirubin: 1 mg/dL (ref 0.3–1.2)

## 2012-03-05 LAB — BASIC METABOLIC PANEL
Calcium: 9.5 mg/dL (ref 8.4–10.5)
Creatinine, Ser: 0.71 mg/dL (ref 0.50–1.35)
GFR calc non Af Amer: >90 mL/min (ref >90)
Glucose, Bld: 111 mg/dL — ABNORMAL HIGH (ref 70–99)
Sodium: 133 mEq/L — ABNORMAL LOW (ref 135–145)

## 2012-03-05 LAB — PROTIME-INR: Prothrombin Time: 16.3 s — ABNORMAL HIGH (ref 11.6–15.2)

## 2012-03-05 LAB — CBC
Hemoglobin: 11.6 g/dL — ABNORMAL LOW (ref 13.0–17.0)
MCH: 29.2 pg (ref 26.0–34.0)
MCV: 80.6 fL (ref 78.0–100.0)
RBC: 3.97 MIL/uL — ABNORMAL LOW (ref 4.22–5.81)

## 2012-03-05 MED ORDER — ONDANSETRON HCL 4 MG/2ML IJ SOLN
4.0000 mg | INTRAMUSCULAR | Status: DC | PRN
  Administered 2012-03-05 – 2012-03-06 (×7): 4 mg via INTRAVENOUS
  Filled 2012-03-05 (×8): qty 2

## 2012-03-05 MED ORDER — DOCUSATE SODIUM 100 MG PO CAPS
100.0000 mg | ORAL_CAPSULE | Freq: Every day | ORAL | Status: DC
  Administered 2012-03-05 – 2012-03-09 (×5): 100 mg via ORAL
  Filled 2012-03-05 (×5): qty 1

## 2012-03-05 MED ORDER — ONDANSETRON HCL 4 MG/2ML IJ SOLN
INTRAMUSCULAR | Status: AC
  Filled 2012-03-05: qty 2

## 2012-03-05 MED ORDER — WARFARIN VIDEO
Freq: Once | Status: AC
  Administered 2012-03-06: 19:00:00

## 2012-03-05 MED ORDER — PATIENT'S GUIDE TO USING COUMADIN BOOK
Freq: Once | Status: AC
  Administered 2012-03-05: 19:00:00
  Filled 2012-03-05 (×2): qty 1

## 2012-03-05 MED ORDER — ONDANSETRON HCL 4 MG/2ML IJ SOLN
INTRAMUSCULAR | Status: DC | PRN
  Administered 2012-03-05: 4 mg via INTRAVENOUS

## 2012-03-05 MED ORDER — HEPARIN (PORCINE) IN NACL 100-0.45 UNIT/ML-% IJ SOLN
1950.0000 [IU]/h | INTRAMUSCULAR | Status: DC
  Administered 2012-03-05: 1400 [IU]/h via INTRAVENOUS
  Administered 2012-03-05: 1150 [IU]/h via INTRAVENOUS
  Administered 2012-03-05: 900 [IU]/h via INTRAVENOUS
  Administered 2012-03-06: 1800 [IU]/h via INTRAVENOUS
  Administered 2012-03-06: 1950 [IU]/h via INTRAVENOUS
  Administered 2012-03-06: 1600 [IU]/h via INTRAVENOUS
  Administered 2012-03-06: 1800 [IU]/h via INTRAVENOUS
  Administered 2012-03-07: 1950 [IU]/h via INTRAVENOUS
  Filled 2012-03-05 (×6): qty 250

## 2012-03-05 MED ORDER — DIPHENHYDRAMINE HCL 50 MG/ML IJ SOLN
INTRAMUSCULAR | Status: AC
  Administered 2012-03-05: 25 mg via INTRAVENOUS
  Filled 2012-03-05: qty 1

## 2012-03-05 MED ORDER — PROMETHAZINE HCL 25 MG/ML IJ SOLN
INTRAMUSCULAR | Status: AC
  Administered 2012-03-05: 12.5 mg
  Filled 2012-03-05: qty 1

## 2012-03-05 MED ORDER — PANTOPRAZOLE SODIUM 40 MG IV SOLR
40.0000 mg | INTRAVENOUS | Status: DC
  Administered 2012-03-05 – 2012-03-06 (×2): 40 mg via INTRAVENOUS
  Filled 2012-03-05 (×4): qty 40

## 2012-03-05 MED ORDER — ACETAMINOPHEN 325 MG PO TABS
650.0000 mg | ORAL_TABLET | Freq: Four times a day (QID) | ORAL | Status: DC | PRN
  Administered 2012-03-05: 650 mg via ORAL
  Filled 2012-03-05: qty 2

## 2012-03-05 MED ORDER — FENTANYL CITRATE 0.05 MG/ML IJ SOLN
INTRAMUSCULAR | Status: DC | PRN
  Administered 2012-03-05: 25 ug via INTRAVENOUS
  Administered 2012-03-05: 50 ug via INTRAVENOUS
  Administered 2012-03-05: 25 ug via INTRAVENOUS
  Administered 2012-03-05: 50 ug via INTRAVENOUS
  Administered 2012-03-05: 25 ug via INTRAVENOUS

## 2012-03-05 MED ORDER — GELATIN ABSORBABLE 12-7 MM EX MISC
CUTANEOUS | Status: AC
  Filled 2012-03-05: qty 1

## 2012-03-05 MED ORDER — IOHEXOL 300 MG/ML  SOLN
150.0000 mL | Freq: Once | INTRAMUSCULAR | Status: AC | PRN
  Administered 2012-03-05: 56 mL via INTRA_ARTERIAL

## 2012-03-05 MED ORDER — MIDAZOLAM HCL 5 MG/5ML IJ SOLN
INTRAMUSCULAR | Status: DC | PRN
  Administered 2012-03-05: 1 mg via INTRAVENOUS
  Administered 2012-03-05: 2 mg via INTRAVENOUS
  Administered 2012-03-05: 1 mg via INTRAVENOUS

## 2012-03-05 MED ORDER — HYDROMORPHONE HCL PF 1 MG/ML IJ SOLN
INTRAMUSCULAR | Status: AC
  Filled 2012-03-05: qty 1

## 2012-03-05 MED ORDER — WARFARIN SODIUM 5 MG PO TABS
5.0000 mg | ORAL_TABLET | Freq: Once | ORAL | Status: AC
  Administered 2012-03-05: 5 mg via ORAL
  Filled 2012-03-05: qty 1

## 2012-03-05 MED ORDER — SODIUM CHLORIDE 0.9 % IJ SOLN
INTRAMUSCULAR | Status: AC
  Administered 2012-03-05: 10 mL
  Filled 2012-03-05: qty 10

## 2012-03-05 MED ORDER — HYDROMORPHONE HCL PF 1 MG/ML IJ SOLN: 0.5000 mg | INTRAMUSCULAR | Status: DC | PRN

## 2012-03-05 MED ORDER — HYDROMORPHONE HCL PF 1 MG/ML IJ SOLN
1.0000 mg | INTRAMUSCULAR | Status: DC | PRN
  Administered 2012-03-05 (×2): 1 mg via INTRAVENOUS
  Filled 2012-03-05: qty 1

## 2012-03-05 MED ORDER — WARFARIN - PHARMACIST DOSING INPATIENT
Freq: Every day | Status: DC
  Administered 2012-03-05: 19:00:00

## 2012-03-05 MED ORDER — PROMETHAZINE HCL 25 MG/ML IJ SOLN
12.5000 mg | Freq: Once | INTRAMUSCULAR | Status: AC
  Administered 2012-03-05: 12.5 mg via INTRAVENOUS
  Filled 2012-03-05: qty 1

## 2012-03-05 MED ORDER — MORPHINE SULFATE 2 MG/ML IJ SOLN
2.0000 mg | INTRAMUSCULAR | Status: DC | PRN
  Administered 2012-03-05 – 2012-03-06 (×11): 4 mg via INTRAVENOUS
  Filled 2012-03-05 (×11): qty 2

## 2012-03-05 NOTE — Progress Notes (Signed)
ANTICOAGULATION CONSULT NOTE - Follow Up Consult  Pharmacy Consult for Heparin and Coumadin Indication: pulmonary embolus, right-sided and right DVT  Allergies  Allergen Reactions  . Tramadol Hcl Other (See Comments)    Headache    Patient Measurements: Height: 5\' 7"  (170.2 cm) Weight: 144 lb 2.9 oz (65.4 kg) IBW/kg (Calculated) : 66.1  Heparin Dosing Weight: 65 kg  Vital Signs: Temp: 101.9 F (38.8 C) (05/26 1504) Temp src: Oral (05/26 1504) BP: 140/89 mmHg (05/26 1504) Pulse Rate: 103  (05/26 1759)  Labs:  Basename 03/05/12 1755 03/05/12 1015 03/05/12 0503 03/04/12 2329 03/04/12 1900 03/03/12 0541  HGB 11.8* -- 11.6* -- -- --  HCT 32.9* -- 32.0* -- 34.9* --  PLT -- -- 343 -- 267 208  APTT -- -- -- -- -- --  LABPROT 16.3* -- -- 15.5* -- --  INR 1.29 -- -- 1.20 -- --  HEPARINUNFRC <0.10* <0.10* -- -- -- --  CREATININE -- -- 0.59 0.71 0.72 --  CKTOTAL -- -- -- -- -- --  CKMB -- -- -- -- -- --  TROPONINI -- -- -- -- -- --    Estimated Creatinine Clearance: 138.5 ml/min (by C-G formula based on Cr of 0.59).  Assessment:  s/p coil embolization of spleen 5/25.  Right PE, preliminary dopplers = right DVT.  RN reports no plans for IVC filter at this time.  Heparin level is still undetectable on 1150 units/hr drip. No known infusion problems. Coumadin begun tonight.  CBC trending down today, but no further drop tonight. Day #1 of 5 minimum overlap of Heparin & Coumadin.  Goal of Therapy:  INR 2-3 Heparin level 0.3-0.7 units/ml Monitor platelets by anticoagulation protocol: Yes   Plan:   Increase heparin drip to 1400 units/hr.  Next heparin level in 6 hours.  Coumadin 5 mg PO just given tonight.  Daily PT/INR.  Continue daily CBC and heparin level.  Coumadin education prior to discharge.  Dennie Fetters, [place] Pager: [phone] 03/05/2012,7:09 PM

## 2012-03-05 NOTE — Progress Notes (Signed)
ANTICOAGULATION CONSULT NOTE - Follow Up Consult  Pharmacy Consult for Heparin and Coumadin Indication: pulmonary embolus and right DVT  Allergies  Allergen Reactions  . Tramadol Hcl Other (See Comments)    Headache    Patient Measurements: Height: 5\' 7"  (170.2 cm) Weight: 144 lb 2.9 oz (65.4 kg) IBW/kg (Calculated) : 66.1  Heparin Dosing Weight: 65 kg  Vital Signs: Temp: 98.7 F (37.1 C) (05/26 0800) Temp src: Oral (05/26 0800) BP: 127/80 mmHg (05/26 0800) Pulse Rate: 94  (05/26 0800)  Labs:  Basename 03/05/12 1015 03/05/12 0503 03/04/12 2329 03/04/12 1900 03/03/12 0541  HGB -- 11.6* -- 12.7* --  HCT -- 32.0* -- 34.9* 31.9*  PLT -- 343 -- 267 208  APTT -- -- -- -- --  LABPROT -- -- 15.5* -- --  INR -- -- 1.20 -- --  HEPARINUNFRC <0.10* -- -- -- --  CREATININE -- 0.59 0.71 0.72 --  CKTOTAL -- -- -- -- --  CKMB -- -- -- -- --  TROPONINI -- -- -- -- --    Estimated Creatinine Clearance: 138.5 ml/min (by C-G formula based on Cr of 0.59).  Assessment:  s/p coil embolization of spleen 5/25.  Right PE, preliminary dopplers = right DVT.  May may need retrievable IVC filter.  Initial heparin level is undetectable on 900 units/hr drip. No known infusion problems. To begin Coumadin tonight. CBC trending down today. No bleeding noted.  Goal of Therapy:  INR 2-3 Heparin level 0.3-0.7 units/ml Monitor platelets by anticoagulation protocol: Yes   Plan:   Increase heparin drip to 1150 units/hr.  Next heparin level in 6 hours.  Coumadin 5 mg PO tonight.  Daily PT/INR.  Continue daily CBC and heparin level.  Coumadin education prior to discharge.  Dennie Fetters, [place] Pager: [phone] 03/05/2012,12:06 PM

## 2012-03-05 NOTE — Progress Notes (Signed)
VASCULAR LAB PRELIMINARY  PRELIMINARY  PRELIMINARY  PRELIMINARY  Bilateral lower extremity venous Dopplers completed.    Preliminary report:  There is acute DVT noted in the left peroneal vein.  Sluggish flow noted in the left posterior tibial veins and in the right popliteal vein.  All other veins appear thrombus free.  Sherren Kerns [place], 03/05/2012, 11:48 AM
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
## 2012-03-05 NOTE — Progress Notes (Signed)
Subjective: Pt resting comfortably.  S/p coil embolization of splenic a. Last pm. Also new finding of PE, now on heparin  Objective: Physical Exam: BP 127/80  Pulse 94  Temp(Src) 98.5 F (36.9 C) (Oral)  Resp 23  Ht 5\' 7"  (1.702 m)  Wt 144 lb 2.9 oz (65.4 kg)  BMI 22.58 kg/m2  SpO2 98% (R)groin site clean, no hematoma, erythema. Abd: soft, mildly tender LUQ    Labs: CBC  Basename 03/05/12 0503 03/04/12 1900  WBC 14.1* 10.8*  HGB 11.6* 12.7*  HCT 32.0* 34.9*  PLT 343 267   BMET  Basename 03/05/12 0503 03/04/12 2329  NA 136 133*  K 4.1 3.8  CL 99 97  CO2 27 28  GLUCOSE 138* 111*  BUN 12 12  CREATININE 0.59 0.71  CALCIUM 9.5 9.5   LFT  Basename 03/05/12 0503  PROT 7.0  ALBUMIN 3.6  AST 61*  ALT 117*  ALKPHOS 139*  BILITOT 1.0  BILIDIR --  IBILI --  LIPASE --   PT/INR  Basename 03/04/12 2329  LABPROT 15.5*  INR 1.20     Studies/Results: Ct Abdomen Pelvis W Wo Contrast  03/04/2012  *RADIOLOGY REPORT*  Clinical Data: Recent splenic laceration. Pulmonary embolism. Possible pseudoaneurysm or active bleeding seen on recent chest CTA.  CT ABDOMEN AND PELVIS WITHOUT AND WITH CONTRAST  Technique:  Multidetector CT imaging of the abdomen and pelvis was performed without contrast material in one or both body regions, followed by contrast material(s) and further sections in one or both body regions.  Contrast: 75mL OMNIPAQUE IOHEXOL 300 MG/ML  SOLN  Comparison: 02/27/2012  Findings: Multiple peripheral splenic lacerations are again demonstrated, without significant interval change.  Two new rounded areas of rapid arterial phase enhancement are now seen in the inferior portion of the spleen, which measure 1.8 cm and 2.7 cm in diameter.  These are consistent with intraparenchymal splenic pseudoaneurysms.  There is no evidence of splenic rupture and hemoperitoneum has resolved since previous study.  The other abdominal parenchymal organs are normal in appearance.  No  evidence of bowel wall thickening or dilatation.  No soft tissue masses are identified.  Images through the lung bases show a new tiny right pleural effusion and right lower lobe pulmonary infarct from the right lower lobe pulmonary embolism from recently demonstrated by chest CTA. Again seen are fractures involving the right superior and inferior pubic rami, right pubis, and left inferior pubic ramus.  IMPRESSION:  1.  Development of two intraparenchymal splenic pseudoaneurysms in the inferior portion the spleen measuring 1.8 cm and 2.7 cm. 2.  Stable peripheral splenic lacerations.  No evidence of perisplenic hematoma or hemoperitoneum. 3.  New small right pleural effusion and right lower lobe pulmonary infarct from pulmonary embolism recently demonstrated on chest CTA. 4.  Multiple pelvic fractures again demonstrated.  These results were called by telephone on 03/04/2012  at  2210 hours to  Dr. Harlon Flor, who verbally acknowledged these results.121  Original Report Authenticated By: Danae Orleans, M.D.   Ct Angio Chest W/cm &/or Wo Cm  03/04/2012  *RADIOLOGY REPORT*  Clinical Data: Trauma, shortness of breath, right ribs/chest pain  CT ANGIOGRAPHY CHEST  Technique:  Multidetector CT imaging of the chest using the standard protocol during bolus administration of intravenous contrast. Multiplanar reconstructed images including MIPs were obtained and reviewed to evaluate the vascular anatomy.  Contrast: OMNIPAQUE IOHEXOL 350 MG/ML SOLN  Comparison: CT chest dated 02/27/2011  Findings: Small filling defect within a subsegmental  branch of the right lower lobe pulmonary artery (series 7/image 170).  Associated the lateral right lower lobe ground-glass opacity (series 6/image 64), compatible with pulmonary infarct.  Mild patchy bibasilar opacities, right greater than left, likely atelectasis.  Visualized thyroid is unremarkable.  The heart is normal in size.  No pericardial effusion.  No suspicious mediastinal,  hilar, or axillary lymphadenopathy.  Visualized upper abdomen is notable for two hyperenhancing foci within the spleen (series 5/image 92).  The etiology of these findings is unclear given the very early arterial phase of enhancement.  Small pseudoaneurysms within the spleen are not excluded.  IMPRESSION: Subsegmental pulmonary embolus in the right lower lobe.  Associated small pulmonary infarct.  Overall clot burden is small.  Two hyperenhancing foci within the spleen, etiology unclear given the very early arterial phase of enhancement.  Small splenic pseudoaneurysms are not excluded.  Consider CT abdomen with/without contrast for further characterization as clinically warranted.  Critical Value/emergent results were called by telephone at the time of interpretation on 03/04/2012  at 2105 hours  to  Dr Marcille Blanco, who verbally acknowledged these results.  Original Report Authenticated By: Charline Bills, M.D.   Dg Chest Port 1 View  03/04/2012  *RADIOLOGY REPORT*  Clinical Data: Acute onset pleuritic chest pain and shortness of breath.  Recent pelvic fractures and splenic laceration.  CHEST - 1 VIEW  Comparison:  None.  Findings: The heart size and mediastinal contours are within normal limits.  Both lungs are clear.  IMPRESSION: No active disease.  Original Report Authenticated By: Danae Orleans, M.D.    Assessment/Plan: Splenic pseudoaneurysms s/p angio/embolization PE Agree to check LE dopplers for evidence of acute DVT. If positive, will proceed with IVC filter. Discussed IVC filter procedure with pt and family, retrievable device. Pt father reports pt had mild rash/flushing after angio last pm, possible reaction to repeat contrast load or from sedation meds.    LOS: 1 day    Brayton El PA-C 03/05/2012 8:41 AM

## 2012-03-05 NOTE — Procedures (Signed)
Spleen embolization No comp
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
## 2012-03-06 LAB — BASIC METABOLIC PANEL
BUN: 10 mg/dL (ref 6–23)
Chloride: 100 mEq/L (ref 96–112)
GFR calc Af Amer: >90 mL/min (ref >90)
Potassium: 4 mEq/L (ref 3.5–5.1)
Sodium: 139 mEq/L (ref 135–145)

## 2012-03-06 LAB — HEPARIN LEVEL (UNFRACTIONATED): Heparin Unfractionated: 0.22 [IU]/mL — ABNORMAL LOW (ref 0.30–0.70)

## 2012-03-06 LAB — CBC
HCT: 33.1 % — ABNORMAL LOW (ref 39.0–52.0)
HCT: 34.8 % — ABNORMAL LOW (ref 39.0–52.0)
Hemoglobin: 11.8 g/dL — ABNORMAL LOW (ref 13.0–17.0)
Hemoglobin: 12.5 g/dL — ABNORMAL LOW (ref 13.0–17.0)
MCHC: 35.9 g/dL (ref 30.0–36.0)
RBC: 4.26 MIL/uL (ref 4.22–5.81)
RDW: 12.4 % (ref 11.5–15.5)
WBC: 15.6 10*3/uL — ABNORMAL HIGH (ref 4.0–10.5)

## 2012-03-06 MED ORDER — SODIUM CHLORIDE 0.9 % IJ SOLN
INTRAMUSCULAR | Status: AC
  Administered 2012-03-06: 3 mL
  Filled 2012-03-06: qty 10

## 2012-03-06 MED ORDER — WARFARIN SODIUM 5 MG PO TABS
5.0000 mg | ORAL_TABLET | Freq: Once | ORAL | Status: AC
  Administered 2012-03-06: 5 mg via ORAL
  Filled 2012-03-06: qty 1

## 2012-03-06 MED ORDER — HYDROCODONE-ACETAMINOPHEN 5-325 MG PO TABS
1.0000 | ORAL_TABLET | ORAL | Status: DC | PRN
Start: 2012-03-06 — End: 2012-03-09
  Administered 2012-03-06 – 2012-03-09 (×18): 2 via ORAL
  Filled 2012-03-06 (×19): qty 2

## 2012-03-06 MED ORDER — MORPHINE SULFATE 2 MG/ML IJ SOLN
2.0000 mg | INTRAMUSCULAR | Status: DC | PRN
  Administered 2012-03-06 – 2012-03-07 (×7): 2 mg via INTRAVENOUS
  Filled 2012-03-06 (×7): qty 1
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
## 2012-03-06 NOTE — Progress Notes (Addendum)
ANTICOAGULATION CONSULT NOTE - Follow Up Consult  Pharmacy Consult for heparin Indication: pulmonary embolus and DVT  Labs:  Basename 03/06/12 1621 03/06/12 0804 03/06/12 0004 03/05/12 1755 03/05/12 0503 03/04/12 2329  HGB -- 12.5* 11.8* -- -- --  HCT -- 34.8* 33.1* 32.9* -- --  PLT -- 446* 403* -- 343 --  APTT -- -- -- -- -- --  LABPROT -- -- -- 16.3* -- 15.5*  INR -- -- -- 1.29 -- 1.20  HEPARINUNFRC 0.27* 0.24* 0.22* -- -- --  CREATININE -- -- 0.71 -- 0.59 0.71  CKTOTAL -- -- -- -- -- --  CKMB -- -- -- -- -- --  TROPONINI -- -- -- -- -- --    Assessment: 21yo male remains subtherapeutic on heparin for PE/DVT though now increasing.  Goal of Therapy:  Heparin level 0.3-0.7 units/ml   Plan:  Will increase heparin gtt to 1950 units/hr and check level in 6hr.  Woodfin Ganja PharmD  03/06/2012,5:16 PM
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
## 2012-03-06 NOTE — Progress Notes (Signed)
ANTICOAGULATION CONSULT NOTE - Follow Up Consult  Pharmacy Consult for Heparin and Coumadin Indication: pulmonary embolus, right-sided and right DVT  Allergies  Allergen Reactions  . Tramadol Hcl Other (See Comments)    Headache    Patient Measurements: Height: 5\' 7"  (170.2 cm) Weight: 144 lb 2.9 oz (65.4 kg) IBW/kg (Calculated) : 66.1  Heparin Dosing Weight: 65 kg  Vital Signs: Temp: 99.4 F (37.4 C) (05/27 0700) Temp src: Oral (05/27 0400) BP: 133/78 mmHg (05/27 0400) Pulse Rate: 115  (05/27 0400)  Labs:  Basename 03/06/12 0804 03/06/12 0004 03/05/12 1755 03/05/12 0503 03/04/12 2329  HGB 12.5* 11.8* -- -- --  HCT 34.8* 33.1* 32.9* -- --  PLT 446* 403* -- 343 --  APTT -- -- -- -- --  LABPROT -- -- 16.3* -- 15.5*  INR -- -- 1.29 -- 1.20  HEPARINUNFRC 0.24* 0.22* <0.10* -- --  CREATININE -- 0.71 -- 0.59 0.71  CKTOTAL -- -- -- -- --  CKMB -- -- -- -- --  TROPONINI -- -- -- -- --    Estimated Creatinine Clearance: 138.5 ml/min (by C-G formula based on Cr of 0.71).  Assessment:  s/p coil embolization of spleen 5/25.  Right PE, preliminary dopplers = right DVT.  RN reports no plans for IVC filter at this time.  Heparin level is still subtherapeutic on 1600 units/hr.  CBC stable.  No bleeding noted.  Day #2 of 5 minimum overlap of Heparin & Coumadin.  Goal of Therapy:  INR 2-3 Heparin level 0.3-0.7 units/ml Monitor platelets by anticoagulation protocol: Yes   Plan:   Increase heparin drip to 1800 units/hr.  Next heparin level in 6 hours.  Coumadin 5 mg PO again tonight.  Daily PT/INR.  Continue daily CBC and heparin level.  Coumadin education prior to discharge.  Dennie Fetters, RPh Pager: [phone] 03/06/2012,9:31 AM

## 2012-03-07 LAB — CBC
HCT: 32.8 % — ABNORMAL LOW (ref 39.0–52.0)
MCHC: 35.7 g/dL (ref 30.0–36.0)
MCV: 81.6 fL (ref 78.0–100.0)
Platelets: 473 10*3/uL — ABNORMAL HIGH (ref 150–400)
RDW: 12.4 % (ref 11.5–15.5)

## 2012-03-07 LAB — PROTIME-INR
INR: 1.72 — ABNORMAL HIGH (ref 0.00–1.49)
INR: 2.01 — ABNORMAL HIGH (ref 0.00–1.49)
Prothrombin Time: 20.5 s — ABNORMAL HIGH (ref 11.6–15.2)
Prothrombin Time: 23.1 s — ABNORMAL HIGH (ref 11.6–15.2)

## 2012-03-07 MED ORDER — COUMADIN BOOK
Freq: Once | Status: DC
  Filled 2012-03-07: qty 1

## 2012-03-07 MED ORDER — WARFARIN SODIUM 5 MG PO TABS
5.0000 mg | ORAL_TABLET | Freq: Once | ORAL | Status: AC
  Administered 2012-03-07: 5 mg via ORAL
  Filled 2012-03-07: qty 1

## 2012-03-07 MED ORDER — HEPARIN (PORCINE) IN NACL 100-0.45 UNIT/ML-% IJ SOLN
2000.0000 [IU]/h | INTRAMUSCULAR | Status: DC
  Administered 2012-03-07 – 2012-03-08 (×3): 2000 [IU]/h via INTRAVENOUS
  Filled 2012-03-07 (×3): qty 250

## 2012-03-07 MED ORDER — PANTOPRAZOLE SODIUM 40 MG PO TBEC
40.0000 mg | DELAYED_RELEASE_TABLET | Freq: Every day | ORAL | Status: DC
  Administered 2012-03-07: 40 mg via ORAL

## 2012-03-07 NOTE — Progress Notes (Signed)
ANTICOAGULATION CONSULT NOTE - Follow Up Consult  Pharmacy Consult for heparin Indication: PE/DVT  Labs:  Basename 03/06/12 2303 03/06/12 1621 03/06/12 0804 03/06/12 0004 03/05/12 1755 03/05/12 0503 03/04/12 2329  HGB -- -- 12.5* 11.8* -- -- --  HCT -- -- 34.8* 33.1* 32.9* -- --  PLT -- -- 446* 403* -- 343 --  APTT -- -- -- -- -- -- --  LABPROT -- -- -- -- 16.3* -- 15.5*  INR -- -- -- -- 1.29 -- 1.20  HEPARINUNFRC 0.54 0.27* 0.24* -- -- -- --  CREATININE -- -- -- 0.71 -- 0.59 0.71  CKTOTAL -- -- -- -- -- -- --  CKMB -- -- -- -- -- -- --  TROPONINI -- -- -- -- -- -- --    Assessment/Plan: 21yo male now therapeutic on heparin for PE/DVT after multiple rate increases.  Will continue gtt at current rate and confirm stable with am labs.  Colleen Can PharmD BCPS 03/07/2012,12:02 AM

## 2012-03-07 NOTE — Progress Notes (Signed)
ANTICOAGULATION CONSULT NOTE - Follow Up Consult  Pharmacy Consult for Heparin + Coumadin Indication: PE/DVT  Allergies  Allergen Reactions  . Tramadol Hcl Other (See Comments)    Headache   Vital Signs: Temp: 98.8 F (37.1 C) (05/28 0742) Temp src: Oral (05/28 0345) BP: 109/59 mmHg (05/28 0345) Pulse Rate: 105  (05/28 0345)  Labs:  Alvira Philips 03/07/12 0430 03/06/12 2303 03/06/12 1621 03/06/12 0804 03/06/12 0004 03/05/12 1755 03/05/12 0503 03/04/12 2329  HGB 11.7* -- -- 12.5* -- -- -- --  HCT 32.8* -- -- 34.8* 33.1* -- -- --  PLT 473* -- -- 446* 403* -- -- --  APTT -- -- -- -- -- -- -- --  LABPROT 20.5* -- -- -- -- 16.3* -- 15.5*  INR 1.72* -- -- -- -- 1.29 -- 1.20  HEPARINUNFRC 0.33 0.54 0.27* -- -- -- -- --  CREATININE -- -- -- -- 0.71 -- 0.59 0.71  CKTOTAL -- -- -- -- -- -- -- --  CKMB -- -- -- -- -- -- -- --  TROPONINI -- -- -- -- -- -- -- --    Estimated Creatinine Clearance: 138.5 ml/min (by C-G formula based on Cr of 0.71).  Medications:  Heparin @ 1950 units/hr  Assessment: 20yom continues on Day #3/5 overlap with heparin and coumadin for PE/DVT.  Heparin level is therapeutic. INR is subtherapeutic but trending up appropriately after 2 doses of coumadin. No bleeding noted. CBC stable.   Goal of Therapy:  Heparin level 0.3-0.7 units/ml INR 2-3 Monitor platelets by anticoagulation protocol: Yes   Plan:  1) Increase heparin to 2000 units/hr to keep level >0.30 2) Repeat coumadin 5mg  x 1 3) Follow up daily heparin level, INR, CBC  Fredrik Rigger 03/07/2012,10:16 AM
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
## 2012-03-07 NOTE — Progress Notes (Signed)
UR complete 

## 2012-03-07 NOTE — Progress Notes (Signed)
Patient transferred to 5150. Report called to RN on 5100 and all questions answered. Patient's VSS and assessment WNL. Patient transferred via wheelchair with NT and family present.

## 2012-03-08 LAB — CBC
HCT: 34.6 % — ABNORMAL LOW (ref 39.0–52.0)
Hemoglobin: 12.3 g/dL — ABNORMAL LOW (ref 13.0–17.0)
MCV: 81.8 fL (ref 78.0–100.0)
RBC: 4.23 MIL/uL (ref 4.22–5.81)
WBC: 12.5 10*3/uL — ABNORMAL HIGH (ref 4.0–10.5)

## 2012-03-08 LAB — HEPARIN LEVEL (UNFRACTIONATED): Heparin Unfractionated: 0.36 [IU]/mL (ref 0.30–0.70)

## 2012-03-08 LAB — PROTIME-INR: INR: 2.53 — ABNORMAL HIGH (ref 0.00–1.49)

## 2012-03-08 MED ORDER — MORPHINE SULFATE 2 MG/ML IJ SOLN: 2.0000 mg | INTRAMUSCULAR | Status: DC | PRN

## 2012-03-08 MED ORDER — BISACODYL 10 MG RE SUPP
10.0000 mg | Freq: Once | RECTAL | Status: AC
  Administered 2012-03-08: 10 mg via RECTAL
  Filled 2012-03-08: qty 1

## 2012-03-08 MED ORDER — HEPARIN (PORCINE) IN NACL 100-0.45 UNIT/ML-% IJ SOLN
2000.0000 [IU]/h | INTRAMUSCULAR | Status: DC
  Administered 2012-03-08 – 2012-03-09 (×2): 2000 [IU]/h via INTRAVENOUS
  Filled 2012-03-08 (×4): qty 250

## 2012-03-08 MED ORDER — WARFARIN SODIUM 2.5 MG PO TABS
2.5000 mg | ORAL_TABLET | Freq: Once | ORAL | Status: AC
  Administered 2012-03-08: 2.5 mg via ORAL
  Filled 2012-03-08: qty 1
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
## 2012-03-08 NOTE — Progress Notes (Signed)
Patient ID: James Barajas, male   DOB: 03/03/1991, 21 y.o.   MRN: [id number]   LOS: 4 days   Subjective: Pain a bit better. Hasn't been OOB yet. Had trouble on bedpan so hasn't had BM. Tolerating regular diet.  Objective: Vital signs in last 24 hours: Temp:  [98.6 F (37 C)-99.6 F (37.6 C)] 98.6 F (37 C) (05/29 0621) Pulse Rate:  [89-102] 96  (05/29 0621) Resp:  [17-21] 17  (05/29 0621) BP: (108-116)/(60-70) 114/70 mmHg (05/29 0621) SpO2:  [98 %-99 %] 99 % (05/29 0621)    Lab Results:  CBC  Basename 03/08/12 0634 03/07/12 0430  WBC 12.5* 15.4*  HGB 12.3* 11.7*  HCT 34.6* 32.8*  PLT 672* 473*   Lab Results  Component Value Date   INR 2.53* 03/08/2012   INR 2.01* 03/07/2012   INR 1.72* 03/07/2012    General appearance: alert and no distress Resp: clear to auscultation bilaterally Cardio: regular rate and rhythm GI: Soft, +BS.  Assessment/Plan: PE -- Patient has become therapeutic on coumadin, will stop heparin. ABL anemia -- Improved today S/p splenic artery coiling/splenic infarction -- Will plan on vaccinations at PCP in about 2 weeks. FEN -- No issues Dispo -- Maybe home this afternoon if no problems with mobilization.   Freeman Caldron, PA-C Pager: [phone] General Trauma PA Pager: [phone]   03/08/2012
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
## 2012-03-08 NOTE — Progress Notes (Addendum)
ANTICOAGULATION CONSULT NOTE - Follow Up Consult  Pharmacy Consult for Heparin + Coumadin Indication: PE/DVT  Allergies  Allergen Reactions  . Tramadol Hcl Other (See Comments)    Headache   Vital Signs: Temp: 98.6 F (37 C) (05/29 0621) Temp src: Oral (05/29 0621) BP: 114/70 mmHg (05/29 0621) Pulse Rate: 96  (05/29 0621)  Labs:  Alvira Philips 03/08/12 4098 03/07/12 1050 03/07/12 0430 03/06/12 2303 03/06/12 0804 03/06/12 0004  HGB 12.3* -- 11.7* -- -- --  HCT 34.6* -- 32.8* -- 34.8* --  PLT 672* -- 473* -- 446* --  APTT -- -- -- -- -- --  LABPROT 27.7* 23.1* 20.5* -- -- --  INR 2.53* 2.01* 1.72* -- -- --  HEPARINUNFRC 0.36 -- 0.33 0.54 -- --  CREATININE -- -- -- -- -- 0.71  CKTOTAL -- -- -- -- -- --  CKMB -- -- -- -- -- --  TROPONINI -- -- -- -- -- --    Estimated Creatinine Clearance: 138.5 ml/min (by C-G formula based on Cr of 0.71).  Medications:  Heparin @ 2000 units/hr  Assessment: 21 yo M continues on Day #4/5 overlap with heparin and coumadin for PE/DVT.  Heparin level is therapeutic. INR is therapeutic today. No bleeding noted. CBC stable.   Goal of Therapy:  Heparin level 0.3-0.7 units/ml INR 2-3 Monitor platelets by anticoagulation protocol: Yes   Plan:  1) Continue heparin at 2000 units/hr 2) Give coumadin 2.5 mg x 1 3) Follow up daily heparin level, INR, CBC  Tryce Surratt L. Illene Bolus, PharmD, BCPS Clinical Pharmacist Pager: [phone] 03/08/2012 9:21 AM     Addendum: Sherron Monday with PA Sanjuana Mae, he will resume heparin for one more day to complete cross-over for a total of 5 days/5 (to end tomorrow, 5/30).  Daniah Zaldivar L. Illene Bolus, PharmD, BCPS Clinical Pharmacist Pager: [phone] 03/08/2012 10:03 AM

## 2012-03-09 LAB — CBC
HCT: 32.2 % — ABNORMAL LOW (ref 39.0–52.0)
MCV: 81.5 fL (ref 78.0–100.0)
RDW: 12.4 % (ref 11.5–15.5)
WBC: 12.1 10*3/uL — ABNORMAL HIGH (ref 4.0–10.5)

## 2012-03-09 LAB — HEPARIN LEVEL (UNFRACTIONATED): Heparin Unfractionated: 0.35 [IU]/mL (ref 0.30–0.70)

## 2012-03-09 MED ORDER — WARFARIN SODIUM 5 MG PO TABS
5.0000 mg | ORAL_TABLET | Freq: Every day | ORAL | Status: DC
  Administered 2012-03-09: 5 mg via ORAL
  Filled 2012-03-09: qty 1

## 2012-03-09 MED ORDER — WARFARIN SODIUM 5 MG PO TABS: 5.0000 mg | ORAL_TABLET | Freq: Every day | ORAL | Status: DC

## 2012-03-09 MED ORDER — POLYETHYLENE GLYCOL 3350 17 G PO PACK: 17.0000 g | PACK | Freq: Every day | ORAL | Status: AC

## 2012-03-09 NOTE — Discharge Summary (Signed)
[name], MD, MPH, FACS Pager: [phone]  

## 2012-03-09 NOTE — Progress Notes (Signed)
Patient ID: James Barajas, male   DOB: 30-Jul-1991, 21 y.o.   MRN: [id number]   LOS: 5 days   Subjective: No new c/o.  Objective: Vital signs in last 24 hours: Temp:  [98.1 F (36.7 C)-99.4 F (37.4 C)] 98.1 F (36.7 C) (05/30 0627) Pulse Rate:  [89-98] 89  (05/30 0627) Resp:  [16-18] 18  (05/30 0627) BP: (104-121)/(53-72) 106/69 mmHg (05/30 0627) SpO2:  [98 %-100 %] 100 % (05/30 0627) Last BM Date: 03/05/12   LABS Pending   General appearance: alert and no distress Resp: clear to auscultation bilaterally Cardio: regular rate and rhythm GI: normal findings: bowel sounds normal and soft, non-tender  Assessment/Plan: PE -- Patient has become therapeutic on coumadin, will stop heparin today if INR still ok. ABL anemia  S/p splenic artery coiling/splenic infarction -- Will plan on vaccinations at PCP in about 2 weeks.  FEN -- No issues  Dispo -- Maybe home this afternoon if labs ok.    Freeman Caldron, PA-C Pager: [phone] General Trauma PA Pager: [phone]   03/09/2012

## 2012-03-09 NOTE — Progress Notes (Signed)
Physical Therapy Treatment Patient Details Name: James Barajas MRN: [id number] DOB: Feb 02, 1991 Today's Date: 03/09/2012 Time: 1047-1100 PT Time Calculation (min): 13 min  PT Assessment / Plan / Recommendation Comments on Treatment Session  James Barajas is moving very well. Will f/u with HHPT, likely will be brief with this. Pt interested in possibly getting into a pool when he gets home, advised him to wait until his incisions are healed and to work possbily with therapist in the pool at home. Went up and down 10 steps with minA, pt and mom confident with going home.     Follow Up Recommendations  Home health PT;Supervision - Intermittent    Barriers to Discharge        Equipment Recommendations  None recommended by PT    Recommendations for Other Services    Frequency    Plan All goals met and education completed, patient discharged from PT services    Precautions / Restrictions Precautions Precautions: Fall Restrictions RLE Weight Bearing: Weight bearing as tolerated       Mobility  Bed Mobility Supine to Sit: 7: Independent (sat up long sitting independently) Sitting - Scoot to Edge of Bed: 7: Independent Transfers Sit to Stand: 6: Modified independent (Device/Increase time);With upper extremity assist Stand to Sit: 6: Modified independent (Device/Increase time);With upper extremity assist Ambulation/Gait Ambulation/Gait Assistance: 5: Supervision Ambulation Distance (Feet): 200 Feet Assistive device: Rolling walker Ambulation/Gait Assistance Details: decreased heel strike on right (weight bearing through toe); better weight bearing through right Gait Pattern: Step-through pattern;Decreased stance time - right;Decreased step length - right Stairs Assistance: 4: Min assist Stairs Assistance Details (indicate cue type and reason): cues for technique and safe maneuvering, minA especially for descending stairs pt with a little more instability but pt able to verbalize correct  technique for home Stair Management Technique: One rail Right;Sideways Number of Stairs: 10     Exercises      PT Goals Acute Rehab PT Goals PT Goal: Supine/Side to Sit - Progress: Met PT Goal: Sit to Supine/Side - Progress: Met PT Goal: Sit to Stand - Progress: Met PT Goal: Stand to Sit - Progress: Met PT Goal: Ambulate - Progress: Progressing toward goal Pt will Go Up / Down Stairs: Flight;with min assist;with rail(s) PT Goal: Up/Down Stairs - Progress: Met (goal set and met today)  Visit Information  Last PT Received On: 03/09/12 Assistance Needed: +1    Subjective Data  Subjective: I feel better now that I've had my pain medicine.  Patient Stated Goal: home   Cognition  Overall Cognitive Status: Appears within functional limits for tasks assessed/performed Arousal/Alertness: Awake/alert Orientation Level: Appears intact for tasks assessed Behavior During Session: [facility] for tasks performed Cognition - Other Comments: brighter today, excited about going home    Balance     End of Session PT - End of Session Equipment Utilized During Treatment: Gait belt Activity Tolerance: Patient tolerated treatment well Patient left: Other (comment) (with mom standing and ambulating in the hallway)    [facility] James Barajas 03/09/2012, 11:12 AM
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
## 2012-03-09 NOTE — Progress Notes (Signed)
Discharge instructions reviewed with pt and pt's mother.  Pt and pt's mother verbalized understanding and questions answered.  Pt discharged in stable condition via wheelchair with mother.  James Barajas [place]

## 2012-03-09 NOTE — Progress Notes (Signed)
I spoke to the patient's father on the phone and to his mother at the bedside.  Plan D/C this PM Patient examined and I agree with the assessment and plan  Violeta Gelinas, MD, MPH, FACS Pager: [phone]  03/09/2012 9:54 AM

## 2012-03-09 NOTE — Progress Notes (Signed)
ANTICOAGULATION CONSULT NOTE - Follow Up Consult  Pharmacy Consult for Heparin + Coumadin Indication: PE/DVT  Allergies  Allergen Reactions  . Tramadol Hcl Other (See Comments)    Headache   Vital Signs: Temp: 98.1 F (36.7 C) (05/30 0627) Temp src: Oral (05/30 0627) BP: 106/69 mmHg (05/30 0627) Pulse Rate: 89  (05/30 0627)  Labs:  Basename 03/09/12 0640 03/08/12 0634 03/07/12 1050 03/07/12 0430  HGB 11.2* 12.3* -- --  HCT 32.2* 34.6* -- 32.8*  PLT 730* 672* -- 473*  APTT -- -- -- --  LABPROT 24.0* 27.7* 23.1* --  INR 2.11* 2.53* 2.01* --  HEPARINUNFRC 0.35 0.36 -- 0.33  CREATININE -- -- -- --  CKTOTAL -- -- -- --  CKMB -- -- -- --  TROPONINI -- -- -- --    Estimated Creatinine Clearance: 138.5 ml/min (by C-G formula based on Cr of 0.71).  Medications:  Heparin @ 2000 units/hr  Assessment: 21 yo M continues on Day #5/5 overlap with heparin and coumadin for PE/DVT. Heparin level is therapeutic. INR is therapeutic today, and has been for the past 48h. No bleeding noted. Thrombocytosis persists, H/H decreased.  Goal of Therapy:  Heparin level 0.3-0.7 units/ml INR 2-3 Monitor platelets by anticoagulation protocol: Yes   Plan:  - Will d/c IV heparin now - Will give Coumadin 5mg  daily - Noted plans to d/c home today, suggest re-check INR Monday.  Mauri Tolen K. Allena Katz, PharmD, BCPS.  Clinical Pharmacist Pager [phone]. 03/09/2012 9:08 AM

## 2012-03-15 ENCOUNTER — Other Ambulatory Visit: Payer: Self-pay

## 2012-03-15 MED ORDER — HYDROCODONE-ACETAMINOPHEN 10-325 MG PO TABS: 1.0000 | ORAL_TABLET | Freq: Four times a day (QID) | ORAL | Status: AC | PRN

## 2012-03-17 ENCOUNTER — Ambulatory Visit (INDEPENDENT_AMBULATORY_CARE_PROVIDER_SITE_OTHER): Payer: Commercial Managed Care - PPO | Admitting: Internal Medicine

## 2012-03-17 ENCOUNTER — Encounter: Payer: Self-pay | Admitting: Internal Medicine

## 2012-03-17 VITALS — BP 104/72 | HR 80 | Temp 98.8°F | Ht 66.0 in | Wt 134.0 lb

## 2012-03-17 DIAGNOSIS — Z7901 Long term (current) use of anticoagulants: Secondary | ICD-10-CM

## 2012-03-17 DIAGNOSIS — I2699 Other pulmonary embolism without acute cor pulmonale: Secondary | ICD-10-CM

## 2012-03-17 DIAGNOSIS — S3609XA Other injury of spleen, initial encounter: Secondary | ICD-10-CM

## 2012-03-17 DIAGNOSIS — S3210XA Unspecified fracture of sacrum, initial encounter for closed fracture: Secondary | ICD-10-CM

## 2012-03-17 DIAGNOSIS — S32810A Multiple fractures of pelvis with stable disruption of pelvic ring, initial encounter for closed fracture: Secondary | ICD-10-CM

## 2012-03-17 DIAGNOSIS — S36039A Unspecified laceration of spleen, initial encounter: Secondary | ICD-10-CM

## 2012-03-17 DIAGNOSIS — S322XXA Fracture of coccyx, initial encounter for closed fracture: Secondary | ICD-10-CM

## 2012-03-17 LAB — PROTIME-INR: Prothrombin Time: 24 seconds — ABNORMAL HIGH (ref 11.6–15.2)

## 2012-03-20 ENCOUNTER — Telehealth: Payer: Self-pay | Admitting: Internal Medicine

## 2012-03-20 NOTE — Telephone Encounter (Signed)
LMOM for Kailin Leu (Mother) @ [phone] stating per MJB, pt does NOT need the vaccine in question (Haemophilus B).  Adv to call us back should she have any further questions.

## 2012-03-20 NOTE — Telephone Encounter (Signed)
Agree he does not need that.

## 2012-03-23 NOTE — Progress Notes (Signed)
Mother informed.

## 2012-03-28 ENCOUNTER — Telehealth: Payer: Self-pay | Admitting: Internal Medicine

## 2012-03-28 MED ORDER — HYDROCODONE-ACETAMINOPHEN 5-500 MG PO TABS: 1.0000 | ORAL_TABLET | Freq: Three times a day (TID) | ORAL | Status: AC | PRN

## 2012-03-28 NOTE — Telephone Encounter (Signed)
Call in Vicodin 5/500 #40 one po q 8 hours prn pain with no refill to [facility]

## 2012-04-03 ENCOUNTER — Ambulatory Visit: Payer: Commercial Managed Care - PPO | Admitting: Internal Medicine

## 2012-04-04 ENCOUNTER — Telehealth: Payer: Self-pay | Admitting: Internal Medicine

## 2012-04-04 NOTE — Telephone Encounter (Signed)
Sp w/Mom, Cala Bradford.  Gave appt for STAT PT/INR 6/27 @ 1215.  Pt will arrive for STAT PT @ 1130.  MJB will discuss pain mgment @ that visit.  Pt has Coumadin to get thru until 6/27.  Mom understands and Grandmother will bring patient to the visit.

## 2012-04-04 NOTE — Telephone Encounter (Signed)
He was supposed to have returned 2 weeks from June 7 which would be this week since I was not here last Friday June 21st so he needs to come in get PT drawn and discuss pain management this week. Is there some reason that this is not possible?

## 2012-04-04 NOTE — Telephone Encounter (Signed)
Would also like to ween him off of narcotic for pain.  James Barajas still feels like he NEEDS pain medicine.  However, the Vicodin is making him feel depressed.  Can they have something else for pain that is a non-narcotic?  My question:  Is it safe for him to wait until 7/1 for a Stat PT/INR?  Please advise.

## 2012-04-06 ENCOUNTER — Encounter: Payer: Self-pay | Admitting: Internal Medicine

## 2012-04-06 ENCOUNTER — Ambulatory Visit (INDEPENDENT_AMBULATORY_CARE_PROVIDER_SITE_OTHER): Payer: Self-pay | Admitting: Internal Medicine

## 2012-04-06 VITALS — BP 118/80 | HR 84 | Temp 98.8°F | Ht 66.0 in | Wt 138.5 lb

## 2012-04-06 DIAGNOSIS — S32810A Multiple fractures of pelvis with stable disruption of pelvic ring, initial encounter for closed fracture: Secondary | ICD-10-CM

## 2012-04-06 DIAGNOSIS — Z7901 Long term (current) use of anticoagulants: Secondary | ICD-10-CM

## 2012-04-06 DIAGNOSIS — I2699 Other pulmonary embolism without acute cor pulmonale: Secondary | ICD-10-CM

## 2012-04-06 DIAGNOSIS — S3609XA Other injury of spleen, initial encounter: Secondary | ICD-10-CM

## 2012-04-06 DIAGNOSIS — S36039A Unspecified laceration of spleen, initial encounter: Secondary | ICD-10-CM

## 2012-04-06 LAB — PROTIME-INR
INR: 1.84 — ABNORMAL HIGH (ref <1.50)
Prothrombin Time: 21.4 seconds — ABNORMAL HIGH (ref 11.6–15.2)

## 2012-04-06 MED ORDER — TRAMADOL HCL 50 MG PO TABS: 50.0000 mg | ORAL_TABLET | Freq: Three times a day (TID) | ORAL | Status: AC | PRN

## 2012-04-06 MED ORDER — WARFARIN SODIUM 6 MG PO TABS: 6.0000 mg | ORAL_TABLET | Freq: Every day | ORAL | Status: DC

## 2012-04-08 ENCOUNTER — Encounter: Payer: Self-pay | Admitting: Internal Medicine

## 2012-04-08 NOTE — Patient Instructions (Addendum)
Continue 5 mg of Coumadin daily and return in 2 weeks. Try Vicodin for pain 5/500 one by mouth every 6 hours when necessary pain.
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
## 2012-04-08 NOTE — Patient Instructions (Addendum)
Increase Coumadin to 6 mg daily and return in July for followup. Try tramadol for pain instead of Vicodin.
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
## 2012-04-17 ENCOUNTER — Ambulatory Visit (INDEPENDENT_AMBULATORY_CARE_PROVIDER_SITE_OTHER): Payer: 59 | Admitting: Internal Medicine

## 2012-04-17 VITALS — BP 108/78 | HR 84 | Temp 97.8°F | Ht 66.0 in | Wt 137.0 lb

## 2012-04-17 DIAGNOSIS — Z7901 Long term (current) use of anticoagulants: Secondary | ICD-10-CM

## 2012-04-17 DIAGNOSIS — IMO0001 Reserved for inherently not codable concepts without codable children: Secondary | ICD-10-CM

## 2012-04-17 DIAGNOSIS — M7918 Myalgia, other site: Secondary | ICD-10-CM

## 2012-04-17 DIAGNOSIS — Z86711 Personal history of pulmonary embolism: Secondary | ICD-10-CM

## 2012-04-17 NOTE — Progress Notes (Signed)
Patient aware.
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
## 2012-04-17 NOTE — Patient Instructions (Addendum)
Continue 6 mg Coumadin daily. Return in 3 weeks.

## 2012-04-18 ENCOUNTER — Encounter: Payer: Self-pay | Admitting: Internal Medicine

## 2012-04-21 ENCOUNTER — Telehealth: Payer: Self-pay | Admitting: Internal Medicine

## 2012-04-21 NOTE — Telephone Encounter (Signed)
13 pages of medical records faxed with charge of $12.25.

## 2012-05-09 ENCOUNTER — Encounter: Payer: Self-pay | Admitting: Internal Medicine

## 2012-05-09 ENCOUNTER — Other Ambulatory Visit: Payer: Self-pay | Admitting: Internal Medicine

## 2012-05-09 ENCOUNTER — Ambulatory Visit (INDEPENDENT_AMBULATORY_CARE_PROVIDER_SITE_OTHER): Payer: 59 | Admitting: Internal Medicine

## 2012-05-09 VITALS — BP 112/82 | HR 84 | Temp 98.5°F | Wt 134.0 lb

## 2012-05-09 DIAGNOSIS — Z7901 Long term (current) use of anticoagulants: Secondary | ICD-10-CM

## 2012-05-09 DIAGNOSIS — I2699 Other pulmonary embolism without acute cor pulmonale: Secondary | ICD-10-CM

## 2012-05-09 DIAGNOSIS — Z5181 Encounter for therapeutic drug level monitoring: Secondary | ICD-10-CM

## 2012-05-09 NOTE — Patient Instructions (Addendum)
Increased dose of Coumadin to 7 mg daily and return in one week.
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
## 2012-05-16 ENCOUNTER — Ambulatory Visit: Payer: 59 | Admitting: Internal Medicine

## 2012-05-16 ENCOUNTER — Encounter: Payer: Self-pay | Admitting: Internal Medicine

## 2012-05-16 ENCOUNTER — Other Ambulatory Visit (INDEPENDENT_AMBULATORY_CARE_PROVIDER_SITE_OTHER): Payer: 59 | Admitting: Internal Medicine

## 2012-05-16 DIAGNOSIS — Z7901 Long term (current) use of anticoagulants: Secondary | ICD-10-CM

## 2012-05-16 NOTE — Patient Instructions (Addendum)
Stop Coumadin at the end of August. We will arrange for hematology consultation with Dr. Cyndie Chime in September.

## 2012-05-16 NOTE — Addendum Note (Signed)
Addended by: Margaree Mackintosh on: 05/16/2012 02:31 PM   Modules accepted: Level of Service
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
## 2012-05-23 ENCOUNTER — Telehealth: Payer: Self-pay | Admitting: Internal Medicine

## 2012-05-23 ENCOUNTER — Telehealth: Payer: Self-pay | Admitting: Oncology

## 2012-05-23 NOTE — Telephone Encounter (Signed)
No, he is to stop Coumadin end of August. We are trying to get him an appointment with Dr. Cyndie Chime .

## 2012-05-23 NOTE — Telephone Encounter (Signed)
Called pt, left message, 1st attempt , regarding new pt appt

## 2012-05-24 ENCOUNTER — Telehealth: Payer: Self-pay | Admitting: Oncology

## 2012-05-24 NOTE — Telephone Encounter (Signed)
Mom informed about stopping Coumadin at the end of August. Dr. Patsy Lager office called and left her a message about the appointment for [facility].

## 2012-05-24 NOTE — Telephone Encounter (Signed)
Talked to pt's mom she will bring pt on 9/11, she is aware of appt and location

## 2012-05-25 ENCOUNTER — Telehealth: Payer: Self-pay | Admitting: Oncology

## 2012-05-25 NOTE — Telephone Encounter (Signed)
Referred by Dr. Marlan Palau Dx- Hypercoag W/U. NP packey mailed out.

## 2012-05-25 NOTE — Telephone Encounter (Signed)
Talked to pt's mom again , she is aware of new appt date and  Time on 06/28/12

## 2012-05-26 ENCOUNTER — Telehealth: Payer: Self-pay | Admitting: Oncology

## 2012-05-26 ENCOUNTER — Other Ambulatory Visit: Payer: Self-pay | Admitting: Oncology

## 2012-05-26 DIAGNOSIS — I2699 Other pulmonary embolism without acute cor pulmonale: Secondary | ICD-10-CM

## 2012-05-26 NOTE — Telephone Encounter (Signed)
C/D on 8/16 for 9/18 visit.  °

## 2012-05-29 ENCOUNTER — Telehealth: Payer: Self-pay | Admitting: Oncology

## 2012-05-29 NOTE — Telephone Encounter (Signed)
Called pt and left message regarding lab before MD visit

## 2012-06-14 ENCOUNTER — Other Ambulatory Visit (HOSPITAL_BASED_OUTPATIENT_CLINIC_OR_DEPARTMENT_OTHER): Payer: 59 | Admitting: Lab

## 2012-06-14 ENCOUNTER — Ambulatory Visit: Payer: 59

## 2012-06-14 DIAGNOSIS — I2699 Other pulmonary embolism without acute cor pulmonale: Secondary | ICD-10-CM

## 2012-06-14 LAB — CBC & DIFF AND RETIC
Eosinophils Absolute: 0.1 10*3/uL (ref 0.0–0.5)
HCT: 43.8 % (ref 38.4–49.9)
LYMPH%: 32.9 % (ref 14.0–49.0)
MONO#: 0.5 10*3/uL (ref 0.1–0.9)
NEUT#: 2.8 10*3/uL (ref 1.5–6.5)
NEUT%: 55.5 % (ref 39.0–75.0)
Platelets: 294 10*3/uL (ref 140–400)
WBC: 5.1 10*3/uL (ref 4.0–10.3)

## 2012-06-16 LAB — FACTOR 5 LEIDEN

## 2012-06-16 LAB — PROTHROMBIN GENE MUTATION

## 2012-06-16 LAB — LUPUS ANTICOAGULANT PANEL
DRVVT: 39.4 secs (ref <45.1)
Lupus Anticoagulant: NOT DETECTED

## 2012-06-16 LAB — BETA-2 GLYCOPROTEIN ANTIBODIES
Beta-2-Glycoprotein I IgA: 5 A Units (ref <20)
Beta-2-Glycoprotein I IgM: 8 M Units (ref <20)

## 2012-06-16 LAB — PROTEIN S ACTIVITY: Protein S Activity: 100 % (ref 69–129)

## 2012-06-16 LAB — ANTITHROMBIN III: AntiThromb III Func: 117 % (ref 76–126)

## 2012-06-21 ENCOUNTER — Encounter: Payer: 59 | Admitting: Oncology

## 2012-06-21 ENCOUNTER — Other Ambulatory Visit: Payer: 59 | Admitting: Lab

## 2012-06-21 ENCOUNTER — Ambulatory Visit: Payer: 59

## 2012-06-28 ENCOUNTER — Ambulatory Visit (HOSPITAL_BASED_OUTPATIENT_CLINIC_OR_DEPARTMENT_OTHER): Payer: 59 | Admitting: Oncology

## 2012-06-28 ENCOUNTER — Ambulatory Visit: Payer: 59

## 2012-06-28 ENCOUNTER — Other Ambulatory Visit: Payer: 59 | Admitting: Lab

## 2012-06-28 VITALS — BP 126/81 | HR 104 | Temp 97.2°F | Resp 20 | Ht 66.5 in | Wt 130.8 lb

## 2012-06-28 DIAGNOSIS — I2699 Other pulmonary embolism without acute cor pulmonale: Secondary | ICD-10-CM
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
## 2013-05-15 ENCOUNTER — Encounter: Payer: Self-pay | Admitting: Internal Medicine

## 2013-05-16 ENCOUNTER — Telehealth: Payer: Self-pay | Admitting: Internal Medicine

## 2013-05-16 NOTE — Telephone Encounter (Signed)
Gentleman states he's here to request medical records and itemized billing for patient.  Form states on behalf of Ralene Ok. Claud Kelp.  Requesting itemized billing and medical records for Feb 27, 2012 and forward.  No phone # or fax # included in request.  Mr. Yetta Barre states he would just like to "pick up" the records when they're ready and I can contact him on his cell #.  Spoke with Dr. Lenord Fellers r/t this is not on letterhead, no phone # or fax # included.  Googled attorney name and found him:  Ralene Ok. Snyder, [address]  [place], [place]  [address].  Phone #[phone].  Spoke with Mr. Ilsa Iha, he did concur that he sent the gentleman with the request.  I inquire as to a fax # which he provided @ [phone].  Advised that we don't typically provide medical records via personal delivery unless the patient is requesting records r/t HIPPA.  I advised Mr. Ilsa Iha that there would be a $10 charge for the medical records.  Mr. Ilsa Iha then informed me that he thought he had clarified the request to say that he needed "itemized billing".  Advised him that I told Mr. Yetta Barre that he would have to obtain the billing from our billing department at [facility].  Supplied Mr. Yetta Barre with phone #[phone].  Advised Mr. Ilsa Iha that the phone # had been provided to Mr. Yetta Barre to contact the billing department for itemized billing.  Explained that we are affiliated with [facility] through [facility] and we do not process our claims through our office, it is handled by the [employer].  Mr. Ilsa Iha then advised he needed nothing from our office then, he would pursue the billing information from the billing office.  Dr. Lenord Fellers made aware of the entire conversation and noted.  Original request scanned into patient chart.

## 2013-07-11 ENCOUNTER — Emergency Department (HOSPITAL_COMMUNITY)
Admission: EM | Admit: 2013-07-11 | Discharge: 2013-07-11 | Disposition: A | Discharge status: Other Acute Inpatient Hospital | Payer: PRIVATE HEALTH INSURANCE | Attending: Emergency Medicine | Admitting: Emergency Medicine

## 2013-07-11 ENCOUNTER — Inpatient Hospital Stay (HOSPITAL_COMMUNITY)
Admission: AD | Admit: 2013-07-11 | Discharge: 2013-07-16 | DRG: 885 | Disposition: A | Discharge status: Home / Self Care | Payer: No Typology Code available for payment source | Source: Intra-hospital | Attending: Psychiatry | Admitting: Psychiatry

## 2013-07-11 ENCOUNTER — Encounter (HOSPITAL_COMMUNITY): Payer: Self-pay | Admitting: Emergency Medicine

## 2013-07-11 ENCOUNTER — Encounter (HOSPITAL_COMMUNITY): Payer: Self-pay

## 2013-07-11 DIAGNOSIS — F988 Other specified behavioral and emotional disorders with onset usually occurring in childhood and adolescence: Secondary | ICD-10-CM | POA: Insufficient documentation

## 2013-07-11 DIAGNOSIS — F4310 Post-traumatic stress disorder, unspecified: Secondary | ICD-10-CM | POA: Diagnosis present

## 2013-07-11 DIAGNOSIS — F909 Attention-deficit hyperactivity disorder, unspecified type: Secondary | ICD-10-CM | POA: Diagnosis present

## 2013-07-11 DIAGNOSIS — F319 Bipolar disorder, unspecified: Principal | ICD-10-CM | POA: Diagnosis present

## 2013-07-11 DIAGNOSIS — Z79899 Other long term (current) drug therapy: Secondary | ICD-10-CM | POA: Insufficient documentation

## 2013-07-11 DIAGNOSIS — Z86711 Personal history of pulmonary embolism: Secondary | ICD-10-CM | POA: Insufficient documentation

## 2013-07-11 DIAGNOSIS — R45851 Suicidal ideations: Secondary | ICD-10-CM

## 2013-07-11 DIAGNOSIS — F17200 Nicotine dependence, unspecified, uncomplicated: Secondary | ICD-10-CM | POA: Insufficient documentation

## 2013-07-11 DIAGNOSIS — F4311 Post-traumatic stress disorder, acute: Secondary | ICD-10-CM

## 2013-07-11 DIAGNOSIS — F900 Attention-deficit hyperactivity disorder, predominantly inattentive type: Secondary | ICD-10-CM

## 2013-07-11 HISTORY — DX: Depression, unspecified: F32.A

## 2013-07-11 HISTORY — DX: Bipolar disorder, unspecified: F31.9

## 2013-07-11 HISTORY — DX: Other pulmonary embolism without acute cor pulmonale: I26.99

## 2013-07-11 HISTORY — DX: Major depressive disorder, single episode, unspecified: F32.9

## 2013-07-11 LAB — COMPREHENSIVE METABOLIC PANEL
AST: 16 U/L (ref 0–37)
Albumin: 4.6 g/dL (ref 3.5–5.2)
BUN: 14 mg/dL (ref 6–23)
CO2: 28 mEq/L (ref 19–32)
Calcium: 9.5 mg/dL (ref 8.4–10.5)
Chloride: 103 mEq/L (ref 96–112)
Creatinine, Ser: 0.95 mg/dL (ref 0.50–1.35)
GFR calc Af Amer: >90 mL/min (ref >90)
GFR calc non Af Amer: >90 mL/min (ref >90)
Glucose, Bld: 91 mg/dL (ref 70–99)
Total Bilirubin: 0.5 mg/dL (ref 0.3–1.2)
Total Protein: 7.7 g/dL (ref 6.0–8.3)

## 2013-07-11 LAB — CBC WITH DIFFERENTIAL/PLATELET
Basophils Relative: 0 % (ref 0–1)
HCT: 43.4 % (ref 39.0–52.0)
Hemoglobin: 15.9 g/dL (ref 13.0–17.0)
MCHC: 36.6 g/dL — ABNORMAL HIGH (ref 30.0–36.0)
Monocytes Absolute: 0.6 10*3/uL (ref 0.1–1.0)
Monocytes Relative: 9 % (ref 3–12)
Neutro Abs: 3.9 10*3/uL (ref 1.7–7.7)
Platelets: 294 10*3/uL (ref 150–400)
RBC: 5.19 MIL/uL (ref 4.22–5.81)

## 2013-07-11 LAB — RAPID URINE DRUG SCREEN, HOSP PERFORMED
Barbiturates: NOT DETECTED
Cocaine: NOT DETECTED
Opiates: NOT DETECTED
Tetrahydrocannabinol: NOT DETECTED

## 2013-07-11 MED ORDER — NICOTINE 21 MG/24HR TD PT24: 21.0000 mg | MEDICATED_PATCH | Freq: Every day | TRANSDERMAL | Status: DC | PRN

## 2013-07-11 MED ORDER — MAGNESIUM HYDROXIDE 400 MG/5ML PO SUSP: 30.0000 mL | Freq: Every day | ORAL | Status: DC | PRN

## 2013-07-11 MED ORDER — ACETAMINOPHEN 325 MG PO TABS: 650.0000 mg | ORAL_TABLET | ORAL | Status: DC | PRN

## 2013-07-11 MED ORDER — IBUPROFEN 600 MG PO TABS
600.0000 mg | ORAL_TABLET | Freq: Three times a day (TID) | ORAL | Status: DC | PRN
  Administered 2013-07-11 – 2013-07-16 (×9): 600 mg via ORAL
  Filled 2013-07-11 (×10): qty 1

## 2013-07-11 MED ORDER — ONDANSETRON HCL 4 MG PO TABS: 4.0000 mg | ORAL_TABLET | Freq: Three times a day (TID) | ORAL | Status: DC | PRN

## 2013-07-11 MED ORDER — ALUM & MAG HYDROXIDE-SIMETH 200-200-20 MG/5ML PO SUSP: 30.0000 mL | ORAL | Status: DC | PRN

## 2013-07-11 MED ORDER — IBUPROFEN 400 MG PO TABS: 600.0000 mg | ORAL_TABLET | Freq: Three times a day (TID) | ORAL | Status: DC | PRN

## 2013-07-11 MED ORDER — ZOLPIDEM TARTRATE 5 MG PO TABS: 5.0000 mg | ORAL_TABLET | Freq: Every evening | ORAL | Status: DC | PRN

## 2013-07-11 MED ORDER — HYDROXYZINE HCL 25 MG PO TABS
25.0000 mg | ORAL_TABLET | ORAL | Status: DC | PRN
  Administered 2013-07-11 – 2013-07-15 (×3): 25 mg via ORAL
  Filled 2013-07-11 (×3): qty 1

## 2013-07-11 MED ORDER — ACETAMINOPHEN 325 MG PO TABS: 650.0000 mg | ORAL_TABLET | Freq: Four times a day (QID) | ORAL | Status: DC | PRN

## 2013-07-11 NOTE — ED Notes (Signed)
Pt given [place] sandwich and warer

## 2013-07-11 NOTE — ED Notes (Signed)
Charge nurse called and informed we need a sitter.

## 2013-07-11 NOTE — ED Notes (Signed)
Report called to Behavioral Health to Maureen Chatters, RN.

## 2013-07-11 NOTE — ED Provider Notes (Signed)
Pt alert, content, vitals normal, nad. Psych team indicates pt accepted to [facility], Dr Lolly Mustache, bed ready.  Pt stable for transfer to [facility].  Suzi Roots, MD 07/11/13 1038

## 2013-07-11 NOTE — Tx Team (Addendum)
Initial Interdisciplinary Treatment Plan  PATIENT STRENGTHS: (choose at least two) Ability for insight Average or above average intelligence Capable of independent living General fund of knowledge Supportive family/friends  PATIENT STRESSORS: Educational concerns Medication change or noncompliance   PROBLEM LIST: Problem List/Patient Goals Date to be addressed Date deferred Reason deferred Estimated date of resolution  Depression                                                       DISCHARGE CRITERIA:  Ability to meet basic life and health needs Improved stabilization in mood, thinking, and/or behavior Motivation to continue treatment in a less acute level of care Reduction of life-threatening or endangering symptoms to within safe limits Verbal commitment to aftercare and medication compliance  PRELIMINARY DISCHARGE PLAN: Attend aftercare/continuing care group Attend PHP/IOP Outpatient therapy Return to previous work or school arrangements  PATIENT/FAMIILY INVOLVEMENT: This treatment plan has been presented to and reviewed with the patient, DENYM RAHIMI, and/or family member,   The patient and family have been given the opportunity to ask questions and make suggestions.  Andrena Mews 07/11/2013, 1:26 PM

## 2013-07-11 NOTE — ED Notes (Addendum)
[facility] notified to transport patient-- [phone].

## 2013-07-11 NOTE — ED Notes (Signed)
Charge nurse notified for sitter / paper scrubs given to pt. / security paged to wand pt. at triage .

## 2013-07-11 NOTE — ED Notes (Signed)
Pts mother Jakyren Fluegge wants to be notified about any updates regarding her sons care and status here in the ED. She sts it is okay to leave a voicemail. [phone]

## 2013-07-11 NOTE — BHH Counselor (Signed)
This Clinical research associate contacted the over night AC(James Barajas) and discussed with her.  A bed was provided for the 504-1, however this writer attempted to contact on call physician(Aggie N, NP) @ 0515am and 0554am, no answer, left message.  It was communicated to this writer that Dr. Lolly Mustache was psychiatrist on call, contacted via pager @0600am  and 0612am, no return call, then Dr. Lucianne Muss was contacted and provided this writer with a direct # to contact Dr. Lolly Mustache, no answer.  Aggie N returned call to this writer at 0730am and then Dr. Lolly Mustache called @0730  am.  This advised Dr. Lolly Mustache no contact was needed as Aggie N accepted pt.

## 2013-07-11 NOTE — Progress Notes (Signed)
Recreation Therapy Notes  Date: 10.01.2014 Time: 3:00pm Location: [address] Dayroom  Group Topic: Coping Skills  Goal Area(s) Addresses:  Patient will identify aspects of their life they are grateful for.  Patient will verbalize importance of recognizing things to be grateful for.  Behavioral Response: Appropriate  Intervention: Air traffic controller  Activity: [employer]. Patient provided a worksheet with various categories of things to be grateful for (nature, work, life, play, health, this moment). Patient was asked to identify at least two things they are personally grateful for to fit in each category.   Education: Discharge Planning, Coping Skills   Education Outcome: Acknowledges understanding  Clinical Observations/Feedback: Patient completed worksheet as requested. Patient identified that recognizing things he is grateful could ultimately encourage him to find positives in himself, which could help alleviate symptoms of depression in the long run.  [name] [name], LRT/CTRS  James Barajas 07/11/2013 4:46 PM

## 2013-07-11 NOTE — ED Notes (Addendum)
Pt denies drinking alcohol or using any recreational drugs. Pt states he has a hx of bipolar disorder, depression, and depersonalization. Pt states he feels disconnected for the world, as if he is not alive. Pt states he wishes he could sleep all day and not be annoyed by the people in this world, pt states people who are alive in this world annoy him and he compares them to mosquitoes. Pt states that he recently watched a yale documentation on death on youtube and he came to the conclusion that he would be better off dead. Pt states he wishes to get help so he does not make this selfish decision, however he is willing to. He believes by coming to get help is the only way he can stay alive. Pt lives in a dormitory with a roommate. Pt states he has a plan to hang himself, pt states it is the best way to go. Pt states he does not have any plans to harm anyone else besides himself. Pt states he has not attempted to hang himself, he has only come up with his plan. Pt states he cannot feel pain nor pleasure. Pt informed of the rules and regulations of this process. Pt informed he cannot use his laptop. Pt's mother is at bedside. House coverage informed via charge nurse that a sitter is needed for the pt.
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
## 2013-07-11 NOTE — ED Notes (Signed)
Telepsych consult complete

## 2013-07-11 NOTE — ED Notes (Signed)
All personal belongings given to mother Lynn Sissel ) at triage.

## 2013-07-11 NOTE — ED Notes (Signed)
Pt is currently speaking with a counselor at [facility] via telepsych computer.

## 2013-07-11 NOTE — BHH Group Notes (Signed)
BHH LCSW Group Therapy  Emotional Regulation 1:15 - 2: 30 PM        07/11/2013    Type of Therapy:  Group Therapy  Participation Level:  Appropriate  Participation Quality:  Appropriate  Affect:  Appropriate  Cognitive:  Attentive Appropriate  Insight:  Developing/Improving  Engagement in Therapy:  Developing/Improving  Modes of Intervention:  Discussion Exploration Problem-Solving Supportive  Summary of Progress/Problems:  Group topic was emotional regulations.  Patient participated in the discussion and was able to identify an emotion that needed to regulated.  Patient shared he also deals with fear from thinking too far into the future. Patient was able to identify approprite coping skills.  Wynn Banker 07/11/2013

## 2013-07-11 NOTE — ED Notes (Signed)
AC notified for sitter.  

## 2013-07-11 NOTE — ED Notes (Signed)
Pt. reports suicidal ideation plans to hang himself ,denies hallucinations , pt. stated history of bipolar disorder.

## 2013-07-11 NOTE — BHH Group Notes (Signed)
Adult Psychoeducational Group Note  Date:  07/11/2013 Time:  11:41 PM  Group Topic/Focus:  Wrap-Up Group:   The focus of this group is to help patients review their daily goal of treatment and discuss progress on daily workbooks.  Participation Level:  Active  Participation Quality:  Appropriate  Affect:  Appropriate  Cognitive:  Appropriate  Insight: Appropriate  Engagement in Group:  Engaged  Modes of Intervention:  Discussion  Additional Comments:  Jaydien expressed that he was getting acquainted with the unit.  He expressed that normally his is introverted and has very little interaction with people but he interacted more since he's been here.  He stated that he doesn't have any coping skills.  Caroll Rancher A 07/11/2013, 11:41 PM

## 2013-07-11 NOTE — ED Notes (Signed)
Spoke with mother regarding transport -- phone number -- [phone].

## 2013-07-11 NOTE — ED Provider Notes (Signed)
CSN: [id number]     Arrival date & time 07/11/13  0026 History   First MD Initiated Contact with Patient 07/11/13 0147     Chief Complaint  Patient presents with  . Suicidal   (Consider location/radiation/quality/duration/timing/severity/associated sxs/prior Treatment) HPI Comments: James Barajas is a 22 y.o. Male who is here for evaluation of suicidal ideation. Tonight, he told his mother. He was thinking about suicide, so she went to his dormitory, then brought him here. He is cooperative and forthright. He, states that he has reached the conclusion that he does not need to be alive. 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 He feels that the world, is always trying to bother him, and he would prefer to be asleep. He has been studying fluoroscopy through online courses, and come to the conclusion that he does not need to be here. He has never tried to hurt himself previously. He last saw a psychiatrist about 18 months ago. He stopped seeing a psychiatrist to to a change of insurance. His psychiatrist tried him on various medications for bipolar disorder, but they did not help. He takes Adderall for ADD, prescribed by his family medicine physician. He has not taken any medications, to hurt himself. He does not currently drink alcohol or taken illegal drugs. There are no other known modifying factors.   The history is provided by the patient and a parent.    Past Medical History  Diagnosis Date  . ADD (attention deficit disorder)   . Bipolar 1 disorder   . PE (pulmonary embolism)   . Depression    Past Surgical History  Procedure Laterality Date  . Spleen surgery     No family history on file. History  Substance Use Topics  . Smoking status: Current Every Day Smoker  . Smokeless tobacco: Never Used  . Alcohol Use: Yes    Review of Systems  All other systems reviewed and are negative.    Allergies  Iodine; Percocet; and Tramadol hcl  Home Medications   Current  Outpatient Rx  Name  Route  Sig  Dispense  Refill  . acetaminophen (TYLENOL) 325 MG tablet   Oral   Take 650 mg by mouth every 6 (six) hours as needed for pain.         Marland Kitchen amphetamine-dextroamphetamine (ADDERALL XR) 20 MG 24 hr capsule   Oral   Take 20 mg by mouth 2 (two) times daily.            BP 122/66  Pulse 67  Temp(Src) 98.3 F (36.8 C) (Oral)  Resp 16  SpO2 99% Physical Exam  Nursing note and vitals reviewed. Constitutional: He is oriented to person, place, and time. He appears well-developed and well-nourished.  HENT:  Head: Normocephalic and atraumatic.  Right Ear: External ear normal.  Left Ear: External ear normal.  Eyes: Conjunctivae and EOM are normal. Pupils are equal, round, and reactive to light.  Neck: Normal range of motion and phonation normal. Neck supple.  Cardiovascular: Normal rate, regular rhythm, normal heart sounds and intact distal pulses.   Pulmonary/Chest: Effort normal and breath sounds normal. He exhibits no bony tenderness.  Abdominal: Soft. Normal appearance. There is no tenderness.  Musculoskeletal: Normal range of motion.  Neurological: He is alert and oriented to person, place, and time. No cranial nerve deficit or sensory deficit. He exhibits normal muscle tone. Coordination normal.  Skin: Skin is warm, dry and intact.  Psychiatric: His behavior is normal. Judgment and thought content  normal.  He freely talks about dying, and suicide, with an unusual smile.    ED Course  Procedures (including critical care time)  0220- consult, TTS.    Labs Review Labs Reviewed  URINE RAPID DRUG SCREEN (HOSP PERFORMED) - Abnormal; Notable for the following:    Amphetamines POSITIVE (*)    All other components within normal limits  CBC WITH DIFFERENTIAL - Abnormal; Notable for the following:    MCHC 36.6 (*)    All other components within normal limits  ETHANOL  COMPREHENSIVE METABOLIC PANEL   Imaging Review No results found.  MDM   1.  Suicide ideation    Suicidal ideation, here voluntarily. He needs assessment by the psychiatric services prior to consideration for placement versus home.  Nursing Notes Reviewed/ Care Coordinated, and agree without changes. Applicable Imaging Reviewed.  Interpretation of Laboratory Data incorporated into ED treatment  Care to oncoming provider team- 0740    Flint Melter, MD 07/11/13 1052

## 2013-07-11 NOTE — Progress Notes (Signed)
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

## 2013-07-12 DIAGNOSIS — F449 Dissociative and conversion disorder, unspecified: Secondary | ICD-10-CM

## 2013-07-12 DIAGNOSIS — F319 Bipolar disorder, unspecified: Principal | ICD-10-CM | POA: Diagnosis present

## 2013-07-12 DIAGNOSIS — F900 Attention-deficit hyperactivity disorder, predominantly inattentive type: Secondary | ICD-10-CM | POA: Diagnosis present

## 2013-07-12 DIAGNOSIS — F3130 Bipolar disorder, current episode depressed, mild or moderate severity, unspecified: Secondary | ICD-10-CM

## 2013-07-12 DIAGNOSIS — F4310 Post-traumatic stress disorder, unspecified: Secondary | ICD-10-CM

## 2013-07-12 DIAGNOSIS — R45851 Suicidal ideations: Secondary | ICD-10-CM

## 2013-07-12 MED ORDER — PAROXETINE HCL ER 12.5 MG PO TB24
12.5000 mg | ORAL_TABLET | Freq: Every day | ORAL | Status: DC
  Administered 2013-07-12 – 2013-07-16 (×5): 12.5 mg via ORAL
  Filled 2013-07-12 (×6): qty 1
  Filled 2013-07-12: qty 14
  Filled 2013-07-12: qty 1

## 2013-07-12 MED ORDER — AMPHETAMINE-DEXTROAMPHET ER 10 MG PO CP24
20.0000 mg | ORAL_CAPSULE | Freq: Every day | ORAL | Status: DC
  Administered 2013-07-12 – 2013-07-16 (×5): 20 mg via ORAL
  Filled 2013-07-12 (×5): qty 2

## 2013-07-12 NOTE — BHH Suicide Risk Assessment (Signed)
Suicide Risk Assessment  Admission Assessment     Nursing information obtained from:    Demographic factors:    Current Mental Status:    Loss Factors:    Historical Factors:    Risk Reduction Factors:     CLINICAL FACTORS:   Severe Anxiety and/or Agitation Bipolar Disorder:   Depressive phase Depression:   Anhedonia Hopelessness Impulsivity Insomnia Recent sense of peace/wellbeing Severe Personality Disorders:   Comorbid depression Previous Psychiatric Diagnoses and Treatments Medical Diagnoses and Treatments/Surgeries  COGNITIVE FEATURES THAT CONTRIBUTE TO RISK:  Closed-mindedness Loss of executive function Polarized thinking Thought constriction (tunnel vision)    SUICIDE RISK:   Moderate:  Frequent suicidal ideation with limited intensity, and duration, some specificity in terms of plans, no associated intent, good self-control, limited dysphoria/symptomatology, some risk factors present, and identifiable protective factors, including available and accessible social support.  PLAN OF CARE: Admitted voluntarily and emergently from [facility] long emergency department for depression suicide ideation. Patient has been diagnosed with attention deficit hyperactivity disorder and bipolar disorder and received multiple medications from the primary psychiatrist in [place].  I certify that inpatient services furnished can reasonably be expected to improve the patient's condition.  Ambermarie Honeyman,JANARDHAHA R. 07/12/2013, 3:17 PM
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
## 2013-07-12 NOTE — Progress Notes (Signed)
D: Per pt he has been adjusting well to the unit. Pt observed interacting with the other patients within the milieu. Pt attended group this evening. Pt denied any SI/HI/AVH. Pt complained of mild r.hip pain. Pt was ordered and administered 600 mg of Ibuprofen. Pt was asleep at follow-up.      A: Writer administered scheduled and prn medications to pt. Continued support and availability as needed was extended to this pt. Staff continue to monitor pt with q87min checks.  R: No adverse drug reactions noted. Pt receptive to treatment. Pt remains safe at this time.

## 2013-07-12 NOTE — BHH Group Notes (Signed)
B[facility] LCSW Group Therapy  [employer] of  1:15 - 2:30 PM  07/12/2013   Type of Therapy:  Group Therapy  Participation Level:  Active  Participation Quality:  Attentive  Affect:  Appropriate  Cognitive:  Appropriate  Insight:  Developing/Improving and Engaged  Engagement in Therapy:  Developing/Improving Engaged  Modes of Intervention:  Discussion, Education, Exploration, Problem-Solving, Rapport Building, Support   Summary of Progress/Problems:  Patient listened attentively to speaker from [employer]. He applauded the speaker for being so forthcoming an stated he has interest in getting involved with [employer].  Wynn Banker 07/12/2013
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
## 2013-07-12 NOTE — Progress Notes (Signed)
D: Patient appropriate and cooperative with staff. Patient's affect and mood is depressed. He reported on the self inventory sheet that his sleep is fair, appetite is good, energy level is normal and ability to pay attention is improving. Patient rated depression "5" and feelings of hopelessness "4". He's not attending groups at this time. Writer observed patient lying in bed the majority of this morning.  A: Support and encouragement provided to patient. Administered scheduled medication per ordering MD. Monitor Q15 minute checks for safety.  R: Patient receptive. Passive SI, but contracts for safety. Denies HI. Patient remains safe on the unit.

## 2013-07-12 NOTE — Progress Notes (Signed)
Adult Psychoeducational Group Note  Date:  07/12/2013 Time:  10:00am Group Topic/Focus:  Therapeutic Activity-Boundaries  Participation Level:  Did Not Attend  Participation Quality:    Affect:   Cognitive:  Insight:  Engagement in Group:    Modes of Intervention:    Additional Comments:  Pt did not attend group.  Shelly Bombard D 07/12/2013, 12:11 PM

## 2013-07-12 NOTE — [facility] Suicide Risk Assessment (Signed)
[facility] INPATIENT:  Family/Significant Other Suicide Prevention Education  Suicide Prevention Education:  Education Completed; James Barajas, Mother, [phone];  has been identified by the patient as the family member/significant other with whom the patient will be residing, and identified as the person(s) who will aid the patient in the event of a mental health crisis (suicidal ideations/suicide attempt).  With written consent from the patient, the family member/significant other has been provided the following suicide prevention education, prior to the and/or following the discharge of the patient.  The suicide prevention education provided includes the following:  Suicide risk factors  Suicide prevention and interventions  National Suicide Hotline telephone number  [facility] assessment telephone number  [facility] Emergency Assistance 911  [facility] and/or Residential Mobile Crisis Unit telephone number  Request made of family/significant other to:  Remove weapons (e.g., guns, rifles, knives), all items previously/currently identified as safety concern.   Patient does not have access to guns, per mother.   Remove drugs/medications (over-the-counter, prescriptions, illicit drugs), all items previously/currently identified as a safety concern.  The family member/significant other verbalizes understanding of the suicide prevention education information provided.  The family member/significant other agrees to remove the items of safety concern listed above.  Wynn Banker 07/12/2013, 11:24 AM

## 2013-07-12 NOTE — Progress Notes (Signed)
Adult Psychoeducational Group Note  Date:  07/12/2013 Time:  3:57 PM  Group Topic/Focus:  Building Self Esteem:   The Focus of this group is helping patients become aware of the effects of self-esteem on their lives, the things they and others do that enhance or undermine their self-esteem, seeing the relationship between their level of self-esteem and the choices they make and learning ways to enhance self-esteem.  Participation Level:  Minimal  Participation Quality:  Appropriate and Attentive  Affect:  Flat  Cognitive:  Alert  Insight: Good  Engagement in Group:  Engaged  Modes of Intervention:  Activity, Discussion, Exploration, Socialization and Support  Additional Comments:  Pt came to group after speaking with the doctor and shared that self-doubt is effecting his self-esteem. Pt plans on changing this by setting goals and reaching them.  Cathlean Cower 07/12/2013, 3:57 PM

## 2013-07-12 NOTE — Progress Notes (Signed)
Recreation Therapy Notes  Date: 10.02.2014 Time: 2:45pom Location: [address] Dayroom  Group Topic: Software engineer Activities (AAA)  Behavioral Response: Engaged, Attentive, Appropriate  Affect: Euthymic  Clinical Observations/Feedback: Dog Team: [employer] Patient interacted appropriately with peer, dog team, LRT and MHT.   [name] Mackayla Mullins, LRT/CTRS  Alexavier Tsutsui L 07/12/2013 5:25 PM

## 2013-07-13 NOTE — Tx Team (Signed)
Interdisciplinary Treatment Plan Update   Date Reviewed:  07/13/2013  Time Reviewed:  12:44 PM  Progress in Treatment:   Attending groups: Yes Participating in groups: Yes Taking medication as prescribed: Yes  Tolerating medication: Yes Family/Significant other contact made: Yes, contact made with patient's mother  Patient understands diagnosis: Yes  Discussing patient identified problems/goals with staff: Yes Medical problems stabilized or resolved: Yes Denies suicidal/homicidal ideation: Yes Patient has not harmed self or others: Yes  For review of initial/current patient goals, please see plan of care.  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 Patient admitted to [facility] from [place] [facility] emergency department He states that"I've come to the conclusion the optimal thing for me to do is not being alive, I would rather stay asleep".   Attendees:  Patient:  07/13/2013 12:44 PM   Signature: Mervyn Gay, MD 07/13/2013 12:44 PM  Signature:  Verne Spurr, PA 07/13/2013 12:44 PM  Signature: Harold Barban, RN 07/13/2013 12:44 PM  Signature: 07/13/2013 12:44 PM  Signature:   07/13/2013 12:44 PM  Signature:  Juline Patch, LCSW 07/13/2013 12:44 PM  Signature:  Reyes Ivan, LCSW 07/13/2013 12:44 PM  Signature:  Sharin Grave Coordinator 07/13/2013 12:44 PM  Signature:   07/13/2013 12:44 PM  Signature:  07/13/2013  12:44 PM  Signature:    Signature:      Scribe for Treatment Team:   Juline Patch,  07/13/2013 12:44 PM

## 2013-07-13 NOTE — Progress Notes (Signed)
Patient ID: James Barajas, male   DOB: [birth year], 22 y.o.   MRN: [id number] Pt visible in the milieu.  Interacting appropriately with staff and peers.  Attending groups.  Needs assessed.  Pt denied.  Fifteen minute checks continue for patient safety.  Pt safe on unit.

## 2013-07-13 NOTE — Progress Notes (Signed)
D: Pt in bed resting. Pt is denying any concerns he wishes for this writer to address at this time.  A: Continued support and avialabity as needed was extended to this pt. Staff continue to monitor pt with q48min checks.  R: Pt remains safe at this time.
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
## 2013-07-13 NOTE — Progress Notes (Signed)
D: Patient's affect is appropriate to circumstance and mood is anxious. He reported on the self inventory sheet that his sleep is fair, appetite is good, energy level is low and ability to pay attention is improving. Patient rated depression "4" and feelings of hopelessness "5". He's interacting more today with peers in the milieu and on the unit. Continues to be compliant with medication regimen.  A: Support and encouragement provided to patient. Scheduled medications administered per MD orders. Maintain Q15 minute checks for safety.  R: Patient receptive. Denies SI/HI. Patient remains safe.

## 2013-07-13 NOTE — [facility] Group Notes (Signed)
[facility] LCSW Aftercare Discharge Planning Group Note   07/13/2013 12:41 PM    Participation Quality:  Appropraite  Mood/Affect:  Appropriate  Depression Rating:  1  Anxiety Rating:  1  Thoughts of Suicide:  No  Will you contract for safety?   NA  Current AVH:  No  Plan for Discharge/Comments:  Patient attending discharge planning group and actively participated in group.  He reports being much better and hopes to discharge from the hospital soon. CSW provided all participants with daily workbook and information on services offered by [facility].   Transportation Means: Patient has transportation.   Supports:  Patient has a support system.   Rotha Cassels, Joesph July

## 2013-07-13 NOTE — [facility] Group Notes (Signed)
Adult Psychoeducational Group Note  Date:  07/13/2013 Time:  9:16 PM  Group Topic/Focus:  Wrap-Up Group:   The focus of this group is to help patients review their daily goal of treatment and discuss progress on daily workbooks.  Participation Level:  Active  Participation Quality:  Appropriate  Affect:  Appropriate  Cognitive:  Appropriate  Insight: Appropriate  Engagement in Group:  Engaged  Modes of Intervention:  Discussion  Additional Comments:  [name] stated that he thinks his medication is making him tired other than that he had a good day.  Caroll Rancher A 07/13/2013, 9:16 PM

## 2013-07-13 NOTE — Progress Notes (Signed)
Adult Psychoeducational Group Note  Date:  07/13/2013 Time:  1:55 PM  Group Topic/Focus:  Early Warning Signs:   The focus of this group is to help patients identify signs or symptoms they exhibit before slipping into an unhealthy state or crisis.  Participation Level:  Active  Participation Quality:  Appropriate and Attentive  Affect:  Appropriate  Cognitive:  Alert and Appropriate  Insight: Good  Engagement in Group:  Engaged  Modes of Intervention:  Discussion, Exploration, Socialization and Support  Additional Comments:  Pt came to group and shared that isolation and not caring were his two early warning signs for relapse.   Cathlean Cower 07/13/2013, 1:55 PM

## 2013-07-13 NOTE — [facility] Group Notes (Signed)
[facility] LCSW Group Therapy  Feelings Around Relapse 1:15 -2:30        07/13/2013  2:27 PM   Type of Therapy:  Group Therapy  Participation Level:  Appropriate  Participation Quality:  Appropriate  Affect:  Appropriate  Cognitive:  Attentive Appropriate  Insight:  Developing/Improving  Engagement in Therapy: Developing/Improving  Modes of Intervention:  Discussion Exploration Problem-Solving Supportive  Summary of Progress/Problems:  The topic for today was feelings around relapse.    Patient processed feelings toward relapse and was able to relate to peers. Patient stated relapse for him would mean feeling if the were not "here," it would not matter.  Patient was able to see how his not being here would effect his mother and how others would be impacted by her.  Patient identified coping skills that can be used to prevent a relapse.   Wynn Banker 07/13/2013 2:27 PM

## 2013-07-14 DIAGNOSIS — F411 Generalized anxiety disorder: Secondary | ICD-10-CM

## 2013-07-14 DIAGNOSIS — F332 Major depressive disorder, recurrent severe without psychotic features: Secondary | ICD-10-CM

## 2013-07-14 DIAGNOSIS — F988 Other specified behavioral and emotional disorders with onset usually occurring in childhood and adolescence: Secondary | ICD-10-CM

## 2013-07-14 NOTE — Progress Notes (Signed)
Writer met with patient 1:1 and he requested ibuprofen for a headache he rated a 5, which he received. Patient reports having had a good day and plans to return to school and take his mid terms. Patient denies si/hi/a/v hallucinations. Support and encouragement offered, safety maintained on unit, will continue to monitor.

## 2013-07-14 NOTE — [facility] Group Notes (Signed)
[facility] Group Notes:  (Nursing/MHT/Case Management/Adjunct)  Date:  07/14/2013  Time:  3:11 PM  Type of Therapy: Psychoeducational Skills  Participation Level: Active  Participation Quality: Appropriate  Affect: Appropriate  Cognitive: Appropriate  Insight: Appropriate  Engagement in Group: Engaged  Modes of Intervention: Problem-solving  Summary of Progress/Problems: Pt attended healthy coping skills group and engaged in treatment.   Jacquelyne Balint Shanta 07/14/2013, 3:11 PM

## 2013-07-14 NOTE — Progress Notes (Signed)
D- James Barajas is pleasant and cooperative but continues to be somewhat isolative.  He rates his depression and hopelessness at 2 and denies SI.  He plans at d/c to use his coping skills to manage his thoughts. States he will be returning to [employer].  He reports "good" sleep and appetite. A- Support and encouragement offered.  Continue current POC and evaluation of treatment goals.  Continue 15' checks for safety.  R- Patient states tx goals are being met.  Safety maintained.

## 2013-07-14 NOTE — Progress Notes (Signed)
Patient ID: James Barajas, male   DOB: [birth year], 22 y.o.   MRN: [id number] Psychoeducational Group Note  Date:  07/14/2013 Time:0900am  Group Topic/Focus:  Identifying Needs:   The focus of this group is to help patients identify their personal needs that have been historically problematic and identify healthy behaviors to address their needs.  Participation Level:  Did Not Attend  Participation Quality:    Affect:  Cognitive:  Insight:   Engagement in Group: Additional Comments: inventory group   [name] 07/14/2013,10:05 AM

## 2013-07-14 NOTE — Progress Notes (Signed)
Adult Psychoeducational Group Note  Date:  07/14/2013 Time:  8:00 pm  Group Topic/Focus:  Wrap-Up Group:   The focus of this group is to help patients review their daily goal of treatment and discuss progress on daily workbooks.  Participation Level:  Active  Participation Quality:  Appropriate  Affect:  Appropriate  Cognitive:  Appropriate  Insight: Appropriate  Engagement in Group:  Engaged  Modes of Intervention:  Discussion, Education, Socialization and Support  Additional Comments:  Pt identified depression as the reason that he is in the hospital during the group. Pt was supportive of others in the meeting and provided positive comments about his peers. 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 Pt also stated that he is a nice person.   Merelyn Klump 07/14/2013, 11:00 PM
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
## 2013-07-14 NOTE — [facility] Group Notes (Signed)
[facility] Group Notes:  (Clinical Social Work)  07/14/2013   3:00-4:00PM  Summary of Progress/Problems:   The main focus of today's process group was for the patient to identify ways in which they have sabotaged their own mental health wellness/recovery.  Motivational interviewing was used to explore the reasons they engage in this behavior, and reasons they may have for wanting to change.  The Stages of Change were explained to the group using a handout, and patients identified where they are with regard to changing self-defeating behaviors.  The patient expressed that he self sabotages with isolation, and he really likes being alone.  When he is around people, they exacerbate his bad thoughts, he said.  He feels it is good for him on the one hand, but also recognizes that he is not going to proceed with his recovery if he maintains the isolative behavior so wants to change.  Today his motivation is 10 out of 10 but he is aware he might change tomorrow.  Type of Therapy:  Process Group  Participation Level:  Active  Participation Quality:  Attentive, Sharing and Supportive  Affect:  Anxious and Blunted  Cognitive:  Oriented  Insight:  Engaged  Engagement in Therapy:  Engaged  Modes of Intervention:  Education, Motivational Interviewing   Ambrose Mantle, LCSW 07/14/2013, 5:03 PM

## 2013-07-15 NOTE — [facility] Group Notes (Signed)
[facility] Group Notes:  (Nursing/MHT/Case Management/Adjunct)  Date:  07/15/2013  Time:  6:43 PM  Type of Therapy:  Psychoeducational Skills  Participation Level:  Active  Participation Quality:  Appropriate  Affect:  Appropriate  Cognitive:  Appropriate  Insight:  Appropriate  Engagement in Group:  Engaged  Modes of Intervention:  Problem-solving  Summary of Progress/Problems: Pt attended healthy coping skills group and also engaged in the positive self talk activity.   Jacquelyne Balint Shanta 07/15/2013, 6:43 PM

## 2013-07-15 NOTE — Progress Notes (Signed)
Patient ID: James Barajas, male   DOB: Feb 05, 1991, 22 y.o.   MRN: [id number] [facility] MD Progress Note 07/15/2013  ANTWOIN LACKEY  MRN:  [id number] Subjective:  Patient states he is doing well today. He is smiling and reports he knows he is doing well because he was able to laugh and smile during a movie yesterday. He reports his depression is low at 1/10 and his anxiety is also low at 3/10.  He reports no side effects to his medication, and he is attending groups. Reports appetite and sleep are much improved. Diagnosis:   DSM5: Trauma-Stressor Disorders:  Posttraumatic Stress Disorder (309.81) Depressive Disorders:  Major Depressive Disorder - Severe (296.23)  Axis I: ADHD, inattentive type, Anxiety Disorder NOS, Major Depression, Recurrent severe and Post Traumatic Stress Disorder Axis II: Deferred Axis III:  Past Medical History  Diagnosis Date  . ADD (attention deficit disorder)   . Bipolar 1 disorder   . PE (pulmonary embolism)   . Depression    Axis IV: educational problems, other psychosocial or environmental problems, problems related to social environment and problems with primary support group Axis V: 41-50 serious symptoms  ADL's:  Intact  Sleep: Good  Appetite:  Good  Suicidal Ideation:  denies Homicidal Ideation:  Denies  Psychiatric Specialty Exam: Review of Systems  Constitutional: Negative.   HENT: Negative.   Eyes: Negative.   Respiratory: Negative.   Cardiovascular: Negative.   Gastrointestinal: Negative.   Genitourinary: Negative.   Musculoskeletal: Negative.   Skin: Negative.   Endo/Heme/Allergies: Negative.   Psychiatric/Behavioral: Positive for depression. The patient is nervous/anxious.     Blood pressure 122/83, pulse 88, temperature 97.8 F (36.6 C), temperature source Oral, resp. rate 18, height 5\' 6"  (1.676 m), weight 58.968 kg (130 lb).Body mass index is 20.99 kg/(m^2).  General Appearance: Casual  Eye Contact::  Good eye contact  Speech:   Normal Rate  Volume:  Normal  Mood:  Anxious and Depressed  Affect:  Smiling, alert   Thought Process:  Coherent  Orientation:  Full (Time, Place, and Person)  Thought Content:  WDL  Suicidal Thoughts:  Yes.  with intent/plan  Homicidal Thoughts:  No  Memory:  Immediate;   Fair Recent;   Fair Remote;   Poor  Judgement:  Poor  Insight:  Fair  Psychomotor Activity:  Decreased  Concentration:  Fair  Recall:  Fair  Akathisia:  No  Handed:  Right  AIMS (if indicated):     Assets:  Physical Health Resilience Social Support  Sleep:  Number of Hours: 6.75   Current Medications: Current Facility-Administered Medications  Medication Dose Route Frequency Provider Last Rate Last Dose  . alum & mag hydroxide-simeth (MAALOX/MYLANTA) 200-200-20 MG/5ML suspension 30 mL  30 mL Oral Q4H PRN Sanjuana Kava, NP      . amphetamine-dextroamphetamine (ADDERALL XR) 24 hr capsule 20 mg  20 mg Oral Daily Nehemiah Settle, MD   20 mg at 07/14/13 0808  . hydrOXYzine (ATARAX/VISTARIL) tablet 25 mg  25 mg Oral Q4H PRN Sanjuana Kava, NP   25 mg at 07/11/13 2305  . ibuprofen (ADVIL,MOTRIN) tablet 600 mg  600 mg Oral TID PRN Audrea Muscat, NP   600 mg at 07/14/13 1258  . magnesium hydroxide (MILK OF MAGNESIA) suspension 30 mL  30 mL Oral Daily PRN Sanjuana Kava, NP      . PARoxetine (PAXIL-CR) 24 hr tablet 12.5 mg  12.5 mg Oral Daily Nehemiah Settle, MD  12.5 mg at 07/14/13 0802    Lab Results: No results found for this or any previous visit (from the past 48 hour(s)).  Physical Findings: AIMS: Facial and Oral Movements Muscles of Facial Expression: None, normal Lips and Perioral Area: None, normal Jaw: None, normal Tongue: None, normal,Extremity Movements Upper (arms, wrists, hands, fingers): None, normal Lower (legs, knees, ankles, toes): None, normal, Trunk Movements Neck, shoulders, hips: None, normal, Overall Severity Severity of abnormal movements (highest score from  questions above): None, normal Incapacitation due to abnormal movements: None, normal Patient's awareness of abnormal movements (rate only patient's report): No Awareness, Dental Status Current problems with teeth and/or dentures?: No Does patient usually wear dentures?: No  CIWA:    COWS:     Treatment Plan Summary: Daily contact with patient to assess and evaluate symptoms and progress in treatment Medication management  Plan:  Review of chart, vital signs, medications, and notes. 1. Continue current plan of care with plans to D/C tomorrow. Medical Decision Making Problem Points:  Established problem, stable/improving (1) and Review of psycho-social stressors (1) Data Points:  Review of medication regiment & side effects (2)  I certify that inpatient services furnished can reasonably be expected to improve the patient's condition.  Rona Ravens. Dorreen Valiente RPAC 12:42 PM 07/15/2013

## 2013-07-15 NOTE — Progress Notes (Signed)
Patient ID: TREI SCHOCH, male   DOB: 12-15-1990, 22 y.o.   MRN: [id number] Psychoeducational Group Note  Date:  07/15/2013 Time:  0900am  Group Topic/Focus:  Making Healthy Choices:   The focus of this group is to help patients identify negative/unhealthy choices they were using prior to admission and identify positive/healthier coping strategies to replace them upon discharge.  Participation Level:  Active  Participation Quality:  Appropriate  Affect:  Appropriate  Cognitive:  Appropriate  Insight:  Supportive  Engagement in Group:  Supportive  Additional Comments:  Inventory group   [name] 07/15/2013,11:10 AM

## 2013-07-15 NOTE — Progress Notes (Signed)
Progress note was reviewed, and I agree with treatment plan at this time. 

## 2013-07-15 NOTE — Progress Notes (Signed)
NP note reviewed. Agree with assessment and treatment plan.

## 2013-07-15 NOTE — Progress Notes (Signed)
Writer entered patients room and observed him doing origami. Patient reports having had a good day and is looking forward to discharge on tomorrow. Patient denies si, hi,a,v hallucinations. Writer informed patient of medication available if needed for sleep. Patient voiced no complaints, safety maintained on unit with 15 min checks, will continue to monitor.

## 2013-07-15 NOTE — Progress Notes (Signed)
Patient ID: IVO MOGA, male   DOB: 11-25-1990, 22 y.o.   MRN: [id number] 07-15-13 @ 1648 nursing shift note: D: pt has been very visible today in the milieu, attending group and taking his medications. He stating he is doing well and added that he watched a movie and really enjoyed himself. He has denied any SI. A: staff has been supportive of him and encouraged him throughout the shift. R: on his inventory sheet he wrote: he slept fair, appetite good, energy normal, attention improving with his depression and hopelessness both at 1. No w/d symptoms. He had hip pain rated at 5 and it was rectified with a ibuprofen prn. After discharge he pains to "use his coping skills". RN will continue to monitor and Q 15 min ck's continue.

## 2013-07-15 NOTE — [facility] Group Notes (Signed)
[facility] Group Notes:  (Clinical Social Work)  07/15/2013   3:00-4:00PM  Summary of Progress/Problems:   The main focus of today's process group was to   identify the patient's current support system and decide on other supports that can be put in place.  The picture on workbook was used to discuss why additional supports are needed, and a hand-out was distributed with four definitions/levels of support, then used to talk about how patients have given and received all different kinds of support.  An emphasis was placed on using counselor, doctor, therapy groups, 12-step groups, and problem-specific support groups to expand supports.  The patient demonstrated considerable insight and was able to empathize and provide illuminating feedback to other patients even much older than himself.  Type of Therapy:  Process Group  Participation Level:  Active  Participation Quality:  Appropriate, Sharing and Supportive  Affect:  Appropriate  Cognitive:  Oriented  Insight:  Engaged  Engagement in Therapy:  Engaged  Modes of Intervention:  Education,  Support and [employer], LCSW 07/15/2013, 4:29 PM

## 2013-07-16 MED ORDER — PAROXETINE HCL ER 12.5 MG PO TB24: 12.5000 mg | ORAL_TABLET | Freq: Every day | ORAL | Status: AC

## 2013-07-16 MED ORDER — AMPHETAMINE-DEXTROAMPHET ER 20 MG PO CP24: 20.0000 mg | ORAL_CAPSULE | Freq: Two times a day (BID) | ORAL | Status: AC

## 2013-07-16 NOTE — Progress Notes (Signed)
[facility] Adult Case Management Discharge Plan :  Will you be returning to the same living situation after discharge: Yes,  Patient will return home with mother. At discharge, do you have transportation home?:Yes,  Mother will transport patient home. Do you have the ability to pay for your medications:Yes,  Patient is able to afford medications.  Release of information consent forms completed and in the chart;  Patient's signature needed at discharge.  Patient to Follow up at: Follow-up Information   Follow up with Dr. Jannifer Franklin On 08/13/2013. (Monday, August 13, 2013 at 2:40 PM)    Contact information:   [address] [place], [place]   [address]  [phone]      Follow up with Dr. Evalina Field On 07/19/2013. (Thursday, July 19, 2013 at 9:15 AM)    Contact information:   [address] [place], [place]   [address]  [phone]      Patient denies SI/HI:  Patient no longer endorsing SI/HI or other thoughts of self harm.   Safety Planning and Suicide Prevention discussed: .Reviewed with all patients during discharge planning group   James Barajas, Joesph July 07/16/2013, 12:06 PM

## 2013-07-16 NOTE — [facility] Suicide Risk Assessment (Signed)
Suicide Risk Assessment  Discharge Assessment     Demographic Factors:  Male, Adolescent or young adult and Caucasian  Mental Status Per Nursing Assessment::   On Admission:     Current Mental Status by Physician: Mental Status Examination: Patient appeared as per his stated age, casually dressed, and fairly groomed, and maintaining good eye contact. Patient has good mood and his affect was constricted. He has normal rate, rhythm, and volume of speech. His thought process is linear and goal directed. Patient has denied suicidal, homicidal ideations, intentions or plans. Patient has no evidence of auditory or visual hallucinations, delusions, and paranoia. Patient has fair insight judgment and impulse control.  Loss Factors: NA  Historical Factors: Prior suicide attempts and Impulsivity  Risk Reduction Factors:   Sense of responsibility to family, Religious beliefs about death, Living with another person, especially a relative, Positive social support, Positive therapeutic relationship and Positive coping skills or problem solving skills  Continued Clinical Symptoms:  Bipolar Disorder:   Depressive phase Depression:   Recent sense of peace/wellbeing Previous Psychiatric Diagnoses and Treatments  Cognitive Features That Contribute To Risk:  Polarized thinking    Suicide Risk:  Minimal: No identifiable suicidal ideation.  Patients presenting with no risk factors but with morbid ruminations; may be classified as minimal risk based on the severity of the depressive symptoms  Discharge Diagnoses:   AXIS I:  ADHD, combined type, Bipolar, Depressed and Post Traumatic Stress Disorder AXIS II:  Deferred AXIS III:   Past Medical History  Diagnosis Date  . ADD (attention deficit disorder)   . Bipolar 1 disorder   . PE (pulmonary embolism)   . Depression    AXIS IV:  other psychosocial or environmental problems and problems related to social environment AXIS V:  61-70 mild  symptoms  Plan Of Care/Follow-up recommendations:  Activity:  As tolerated Diet:  Regular  Is patient on multiple antipsychotic therapies at discharge:  No   Has Patient had three or more failed trials of antipsychotic monotherapy by history:  No  Recommended Plan for Multiple Antipsychotic Therapies: NA  Graydon Fofana,JANARDHAHA R. 07/16/2013, 2:37 PM

## 2013-07-16 NOTE — [facility] Group Notes (Signed)
[facility] LCSW Aftercare Discharge Planning Group Note   07/16/2013 9:54 AM    Participation Quality:  Appropraite  Mood/Affect:  Appropriate  Depression Rating:  1  Anxiety Rating:  1  Thoughts of Suicide:  No  Will you contract for safety?   NA  Current AVH:  No  Plan for Discharge/Comments:  Patient attending discharge planning group and actively participated in group.  He advised of being ready to discharge today.  He is agreeable to follow up with Dr. Laymond Purser and Dr. Jannifer Franklin.  CSW provided all participants with daily workbook and information on services offered by [facility].   Transportation Means: Patient has transportation.   Supports:  Patient has a support system.   Rowan Blaker, Joesph July

## 2013-07-16 NOTE — [facility] Group Notes (Signed)
[facility] LCSW Group Therapy          Overcoming Obstacles        1:15 -2:30        07/16/2013   3:44 PM      Type of Therapy:  Group Therapy  Participation Level:  Appropriate  Participation Quality:  Appropriate  Affect:  Appropriate, Alert  Cognitive:  Attentive Appropriate  Insight: Developing/Improving Engaged  Engagement in Therapy: Developing/Imprvoing Engaged  Modes of Intervention:  Discussion Exploration  Education Rapport BuildingProblem-Solving Support  Summary of Progress/Problems:  The main focus of today's group was overcoming  Obstacles.  Patient shared he has to let go of things in the past and accept he can not change what has happened.  Patient shared he looks forward to discharging and getting back to school.   Wynn Banker 07/16/2013    3:44 PM     '

## 2013-07-16 NOTE — Progress Notes (Signed)
Adult Psychoeducational Group Note  Date:  07/16/2013 Time: 11:00am Group Topic/Focus:  Wellness Toolbox:   The focus of this group is to discuss various aspects of wellness, balancing those aspects and exploring ways to increase the ability to experience wellness.  Patients will create a wellness toolbox for use upon discharge.  Participation Level:  Active  Participation Quality:  Appropriate and Attentive  Affect:  Appropriate  Cognitive:  Alert and Appropriate  Insight: Appropriate  Engagement in Group:  Engaged  Modes of Intervention:  Discussion and Education  Additional Comments:  Pt attended and participated in group. When ask what self care meant to him pt stated vigilant triggers being aware of things you need to do to stay well. Pt rated his self care as a 8. Pt stated he needs to exercise more.  Shelly Bombard D 07/16/2013, 1:24 PM

## 2013-07-16 NOTE — Tx Team (Signed)
Interdisciplinary Treatment Plan Update   Date Reviewed:  07/16/2013  Time Reviewed:  9:36 AM  Progress in Treatment:   Attending groups: Yes Participating in groups: Yes Taking medication as prescribed: Yes  Tolerating medication: Yes Family/Significant other contact made: Yes, contact made with patient's mother  Patient understands diagnosis: Yes  Discussing patient identified problems/goals with staff: Yes Medical problems stabilized or resolved: Yes Denies suicidal/homicidal ideation: Yes Patient has not harmed self or others: Yes  For review of initial/current patient goals, please see plan of care.  Estimated Length of Stay:   Discharge today  Reasons for Continued Hospitalization:   New Problems/Goals identified:    Discharge Plan or Barriers:   Home with outpatient follow up with Dr. Jannifer Franklin and Dr. Evalina Field  Additional Comments:  N/A  Attendees:  Patient: James Barajas 07/16/2013 9:36 AM   Signature: Mervyn Gay, MD 07/16/2013 9:36 AM  Signature:  Verne Spurr, PA 07/16/2013 9:36 AM  Signature: Harold Barban, RN 07/16/2013 9:36 AM  Signature: 07/16/2013 9:36 AM  Signature:   07/16/2013 9:36 AM  Signature:  Juline Patch, LCSW 07/16/2013 9:36 AM  Signature:  Reyes Ivan, LCSW 07/16/2013 9:36 AM  Signature:  Sharin Grave Coordinator 07/16/2013 9:36 AM  Signature:   07/16/2013 9:36 AM  Signature:  07/16/2013  9:36 AM  Signature:    Signature:      Scribe for Treatment Team:   Juline Patch,  07/16/2013 9:36 AM

## 2013-07-16 NOTE — Discharge Summary (Signed)
Physician Discharge Summary Note  Patient:  James Barajas is an 22 y.o., male MRN:  [id number] DOB:  20-Sep-1991 Patient phone:  [phone] (home)  Patient address:   [address] [place] [address],   Date of Admission:  07/11/2013 Date of Discharge:  07/16/2013   Reason for Admission:  Suicidal ideation  Discharge Diagnoses: Principal Problem:   Suicidal ideations Active Problems:   ADHD (attention deficit hyperactivity disorder), inattentive type   Bipolar I disorder, most recent episode (or current) unspecified  ROS  DSM5: Schizophrenia Disorders:  Obsessive-Compulsive Disorders:  Trauma-Stressor Disorders:  Substance/Addictive Disorders:  Depressive Disorders: Major Depressive Disorder - Severe (296.23)  AXIS I: Bipolar, Depressed, Post Traumatic Stress Disorder 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  ADD (attention deficit disorder)    .  Bipolar 1 disorder    .  PE (pulmonary embolism)    .  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 Patient admitted to [facility] from [facility] long emergency department He states that"I've come to the conclusion the optimal thing for me to do is not being alive, I would rather stay asleep". He displays an apathetic mood with an obsessive view on death and dying, no friends and limited contact with family members, not involved in an extra-articular activities--"I'm separating myself from life and I don't want to deal with the burden of life". Patient receives no pleasure out of life. Patient endorses  experiencing depersonalization and derealization. Patient states he's disconnected his cell phone for several months because he he doesn't want to talk anymore and he has no friends, doesn't see the point in friendships.         James Barajas was admitted to the adult unit. He was evaluated and his symptoms were identified as above. Medication management was discussed and initiated. He was oriented to the unit and encouraged to participate in unit programming. Medical problems were identified and treated appropriately. Home medication was restarted as needed.        The patient was evaluated each day by a clinical provider to ascertain the patient's response to treatment.  Improvement was noted by the patient's report of decreasing symptoms, improved sleep and appetite, affect, medication tolerance, behavior, and participation in unit programming.  James Barajas was asked each day to complete a self inventory noting mood, mental status, pain, new symptoms, anxiety and concerns.  His behavior was appropriate while on the unit. He was courteous and respectful to other patients and was not a behavioral problem for the staff.         He responded well to medication and being in a therapeutic and supportive environment. Positive and appropriate behavior was noted and the patient was motivated for recovery.  Coping skills, problem solving as well as relaxation therapies were also part of the unit programming. James Barajas felt that he had learned some useful techniques for self soothing while at [facility].          By the day of discharge James Barajas was in much improved  condition than upon admission.  Symptoms were reported as significantly decreased or resolved completely.  The patient denied SI/HI and voiced no AVH. He was motivated to continue taking medication with a goal of continued improvement in mental health.          James Barajas was discharged home with a plan to follow up as noted below.  Consults:  None  Significant Diagnostic  Studies:  labs: CBC, CMP, UA, UDS  Discharge Vitals:   Blood pressure 109/75, pulse 92, temperature 97.4 F (36.3 C), temperature source Oral, resp. rate 16, height 5\' 6"  (1.676 m), weight 58.968 kg (130 lb). Body mass index is 20.99 kg/(m^2). Lab Results:   No results found for this or any previous visit (from the past 72 hour(s)).  Physical Findings: AIMS: Facial and Oral Movements Muscles of Facial Expression: None, normal Lips and Perioral Area: None, normal Jaw: None, normal Tongue: None, normal,Extremity Movements Upper (arms, wrists, hands, fingers): None, normal Lower (legs, knees, ankles, toes): None, normal, Trunk Movements Neck, shoulders, hips: None, normal, Overall Severity Severity of abnormal movements (highest score from questions above): None, normal Incapacitation due to abnormal movements: None, normal Patient's awareness of abnormal movements (rate only patient's report): No Awareness, Dental Status Current problems with teeth and/or dentures?: No Does patient usually wear dentures?: No  CIWA:    COWS:     Psychiatric Specialty Exam: See Psychiatric Specialty Exam and Suicide Risk Assessment completed by Attending Physician prior to discharge.  Discharge destination:  Home  Is patient on multiple antipsychotic therapies at discharge:  No   Has Patient had three or more failed trials of antipsychotic monotherapy by history:  No  Recommended Plan for Multiple Antipsychotic Therapies: NA  Discharge Orders   Future Orders Complete By Expires   Diet - low sodium heart healthy  As directed    Discharge instructions  As directed    Comments:     Take all of your medications as directed. Be sure to keep all of your follow up appointments.  If you are unable to keep your follow up appointment, call your Doctor's office to let them know, and reschedule.  Make sure that you have enough medication to last until your appointment. Be sure to get plenty of rest. Going  to bed at the same time each night will help. Try to avoid sleeping during the day.  Increase your activity as tolerated. Regular exercise will help you to sleep better and improve your mental health. Eating a heart healthy diet is recommended. Try to avoid salty or fried foods. Be sure to avoid all alcohol and illegal drugs.   Increase activity slowly  As directed        Medication List    STOP taking these medications       acetaminophen 325 MG tablet  Commonly known as:  TYLENOL      TAKE these medications     Indication   amphetamine-dextroamphetamine 20 MG 24 hr capsule  Commonly known as:  ADDERALL XR  Take 1 capsule (20 mg total) by mouth 2 (two) times daily. For ADHD.   Indication:  Attention Deficit Hyperactivity Disorder     PARoxetine 12.5 MG 24 hr tablet  Commonly known as:  PAXIL-CR  Take 1 tablet (12.5 mg total) by mouth daily. For anxiety and depression.   Indication:  Major Depressive Disorder           Follow-up Information   Follow up with Dr. Jannifer Franklin On  08/13/2013. (Monday, August 13, 2013 at 2:40 PM)       Follow up with Dr. Evalina Field.      Follow-up recommendations:   Activities: Resume activity as tolerated. Diet: Heart healthy low sodium diet Tests: Follow up testing will be determined by your out patient provider. Comments:   Total Discharge Time:  Greater than 30 minutes.  Signed: MASHBURN,NEIL 07/16/2013, 11:36 AM  Patient was seen for psychiatric evaluation, suicidal risk assessment, and developed treatment plan. Case discussed with treatment team and reviewed the information documented and agree with the treatment plan.   Dorsie Sethi,JANARDHAHA R. 07/24/2013 12:40 PM

## 2013-07-16 NOTE — Progress Notes (Signed)
Recreation Therapy Notes  Date: 10.06.2014 Time: 3:00pm Location: [address] Dayroom  Group Topic: Wellness  Goal Area(s) Addresses:  Patient will verbalize benefit of whole wellness. Patient will identify at least two ways they are investing in each defined dimension of whole wellness.  Behavioral Response: Engaged, Attentive, Appropriate  Intervention: Air traffic controller  Activity: Wellness Flower. Patients were provided a worksheet with six dimensions of whole wellness - Wellness, Relationships, Physical Environment, Fun & Creativity, Personal Development, & Future. Patients were asked to identify two ways they are personally investing in each dimension.   Education: Discharge Planning.    Education Outcome: Acknowledges understanding   Clinical Observations/Feedback: Patient attended group session, completing worksheet as requested. Patient successfully connected each dimension to one another by giving an example of how they are each connected.   [name] Devine Dant, LRT/CTRS  Jearl Klinefelter 07/16/2013 3:58 PM

## 2013-07-16 NOTE — Progress Notes (Signed)
Adult Psychoeducational Group Note  Date:  07/15/13 Time:  8:00 pm  Group Topic/Focus:  Wrap-Up Group:   The focus of this group is to help patients review their daily goal of treatment and discuss progress on daily workbooks.  Participation Level:  Active  Participation Quality:  Appropriate, Sharing and Supportive  Affect:  Appropriate  Cognitive:  Appropriate  Insight: Good  Engagement in Group:  Engaged  Modes of Intervention:  Discussion, Education, Socialization and Support  Additional Comments: Pt stated that his depression has brought him to [facility]. Pt was asked to share two coping skills that he has learned since being in the hospital and he replied that he will decrease isolation and rely more on his natural supports.   Libra Gatz 07/16/2013, 12:06 AM

## 2013-07-17 NOTE — Progress Notes (Signed)
Pt attended spiritual care group on grief and loss facilitated by chaplain Burnis Kingfisher.   Group opened with brief discussion and psycho-social ed around grief and loss in relationships and in relation to self - identifying life patterns, circumstances, changes that cause losses. Established group norm of speaking from own life experience. Pt's then engaged one another around significant losses in their lives, exploring how these losses affected them, what norms and themes they can identify around grief, and providing support for one another.   Pt was present and attentive in group.  Offered support to another group member.  Did not contribute further to discussion around loss.    Belva Crome MDiv

## 2013-07-19 NOTE — Progress Notes (Signed)
Patient Discharge Instructions:  After Visit Summary (AVS):   Faxed to:  07/19/13 Psychiatric Admission Assessment Note:   Faxed to:  07/19/13 Suicide Risk Assessment - Discharge Assessment:   Faxed to:  07/19/13 Faxed/Sent to the Next Level Care provider:  07/19/13 Faxed to Evalina Field PhD @ [phone] Faxed to [facility] @ [phone]  Jerelene Redden, 07/19/2013, 2:16 PM
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
## 2013-07-27 IMAGING — CR DG CHEST 1V PORT
1 series · 1 of 1 positions shown · non-contrast
Comparison: Yesterday

CLINICAL DATA: Pneumothorax

PORTABLE CHEST - 1 VIEW

[view not recorded]
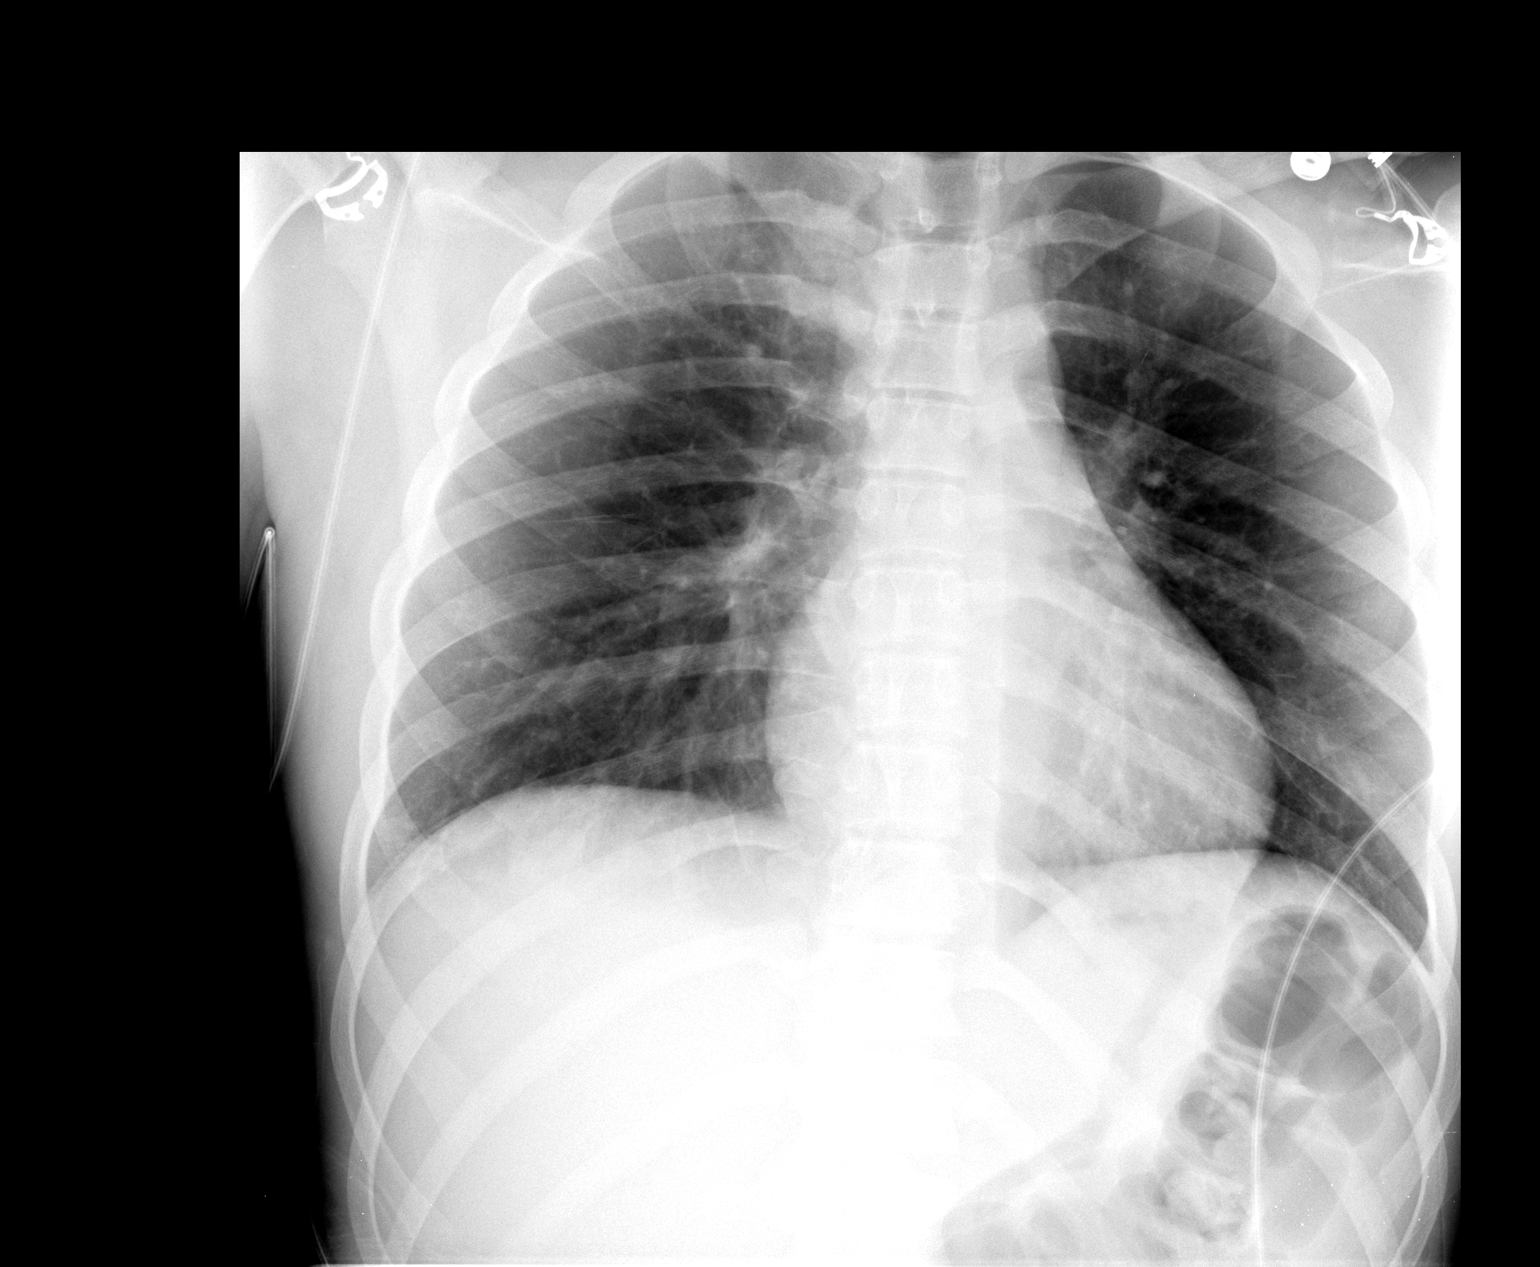

[1 of 1 positions shown; findings below may reference images not displayed]

FINDINGS: No pneumothorax.  Linear atelectasis at the lung bases.
No rib fracture.  Normal heart size.
IMPRESSION: No evidence of pneumothorax.
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
## 2020-07-18 ENCOUNTER — Encounter (INDEPENDENT_AMBULATORY_CARE_PROVIDER_SITE_OTHER): Payer: Self-pay | Admitting: Ophthalmology

## 2020-07-18 ENCOUNTER — Ambulatory Visit (INDEPENDENT_AMBULATORY_CARE_PROVIDER_SITE_OTHER): Payer: BC Managed Care – PPO | Admitting: Ophthalmology

## 2020-07-18 ENCOUNTER — Other Ambulatory Visit: Payer: Self-pay

## 2020-07-18 ENCOUNTER — Ambulatory Visit: Payer: PRIVATE HEALTH INSURANCE

## 2020-07-18 DIAGNOSIS — H3581 Retinal edema: Secondary | ICD-10-CM

## 2020-07-18 DIAGNOSIS — H539 Unspecified visual disturbance: Secondary | ICD-10-CM | POA: Diagnosis not present

## 2020-07-19 ENCOUNTER — Encounter (INDEPENDENT_AMBULATORY_CARE_PROVIDER_SITE_OTHER): Payer: Self-pay | Admitting: Ophthalmology
